# Patient Record
Sex: Female | Born: 1980 | Race: Black or African American | Hispanic: No | Marital: Married | Smoking: Never smoker
Health system: Southern US, Community
[De-identification: ages and names within clinical notes are randomized; demographics above are authoritative.]

## PROBLEM LIST (undated history)

## (undated) DIAGNOSIS — E876 Hypokalemia: Secondary | ICD-10-CM

## (undated) DIAGNOSIS — Z8679 Personal history of other diseases of the circulatory system: Secondary | ICD-10-CM

## (undated) DIAGNOSIS — M329 Systemic lupus erythematosus, unspecified: Secondary | ICD-10-CM

## (undated) HISTORY — PX: SLEEVE GASTROPLASTY: SHX1101

## (undated) HISTORY — DX: Hypokalemia: E87.6

## (undated) HISTORY — DX: Systemic lupus erythematosus, unspecified: M32.9

## (undated) HISTORY — PX: TONSILLECTOMY: SUR1361

## (undated) HISTORY — DX: Personal history of other diseases of the circulatory system: Z86.79

---

## 2011-06-11 ENCOUNTER — Emergency Department (HOSPITAL_COMMUNITY)
Admission: EM | Admit: 2011-06-11 | Discharge: 2011-06-11 | Disposition: A | Discharge status: Home / Self Care | Payer: 59 | Source: Home / Self Care | Attending: Family Medicine | Admitting: Family Medicine

## 2011-06-11 ENCOUNTER — Encounter (HOSPITAL_COMMUNITY): Payer: Self-pay

## 2011-06-11 DIAGNOSIS — B349 Viral infection, unspecified: Secondary | ICD-10-CM

## 2011-06-11 DIAGNOSIS — N390 Urinary tract infection, site not specified: Secondary | ICD-10-CM

## 2011-06-11 DIAGNOSIS — B9789 Other viral agents as the cause of diseases classified elsewhere: Secondary | ICD-10-CM

## 2011-06-11 LAB — POCT PREGNANCY, URINE: Preg Test, Ur: NEGATIVE

## 2011-06-11 MED ORDER — CIPROFLOXACIN HCL 500 MG PO TABS: 500.0000 mg | ORAL_TABLET | Freq: Two times a day (BID) | ORAL | Status: AC

## 2011-06-11 MED ORDER — ONDANSETRON HCL 4 MG PO TABS: 4.0000 mg | ORAL_TABLET | Freq: Three times a day (TID) | ORAL | Status: AC | PRN

## 2011-06-11 MED ORDER — HYDROCODONE-ACETAMINOPHEN 7.5-500 MG/15ML PO SOLN: 15.0000 mL | Freq: Three times a day (TID) | ORAL | Status: AC | PRN

## 2011-06-11 NOTE — ED Provider Notes (Signed)
History     CSN: [id number]  Arrival date & time 06/11/11  1750   First MD Initiated Contact with Patient 06/11/11 1840      Chief Complaint  Patient presents with  . Abdominal Pain    (Consider location/radiation/quality/duration/timing/severity/associated sxs/prior treatment) HPI Comments: 31 year old female with history of obesity. Here complaining of diffuse abdominal pain, nausea, and vomiting for 3 days. Associated with nasal congestion and sore throat. Denies diarrhea. No fever but has had chills. No cough, shortness of breath or chest pain. Last vomiting yesterday evening food content no blood or bile. Has not had a menstrual period in over 2 years as she had a Mirena IUD that was  removed last week and patient was started on oral contraceptive pills. Her pregnancy test is negative today. Denies dysuria or hematuria no flank or back pain no vaginal discharge. Husband recent gastroenteritis symptoms.   History reviewed. No pertinent past medical history.  History reviewed. No pertinent past surgical history.  History reviewed. No pertinent family history.  History  Substance Use Topics  . Smoking status: Never Smoker   . Smokeless tobacco: Not on file  . Alcohol Use: No    OB History    Grav Para Term Preterm Abortions TAB SAB Ect Mult Living                  Review of Systems  Constitutional: Positive for appetite change. Negative for fever and chills.  HENT: Positive for congestion, sore throat and rhinorrhea.   Respiratory: Negative for cough and shortness of breath.   Gastrointestinal: Positive for nausea, vomiting and abdominal pain. Negative for diarrhea.  Genitourinary: Negative for dysuria, frequency, flank pain, vaginal bleeding and vaginal discharge.  Skin: Negative for rash.    Allergies  Review of patient's allergies indicates no known allergies.  Home Medications   Current Outpatient Rx  Name Route Sig Dispense Refill  . DIMENHYDRINATE 50 MG PO  TABS Oral Take 50 mg by mouth every 8 (eight) hours as needed.    Marland Kitchen CIPROFLOXACIN HCL 500 MG PO TABS Oral Take 1 tablet (500 mg total) by mouth every 12 (twelve) hours. 6 tablet 0  . HYDROCODONE-ACETAMINOPHEN 7.5-500 MG/15ML PO SOLN Oral Take 15 mLs by mouth every 8 (eight) hours as needed for pain or cough. 120 mL 0  . ONDANSETRON HCL 4 MG PO TABS Oral Take 1 tablet (4 mg total) by mouth every 8 (eight) hours as needed for nausea. 10 tablet 0    BP 133/78  Pulse 82  Temp(Src) 98.2 F (36.8 C) (Oral)  Resp 16  SpO2 99%  LMP 05/21/2011  Physical Exam  Constitutional: She is oriented to person, place, and time. She appears well-developed and well-nourished. No distress.  HENT:  Head: Normocephalic and atraumatic.       Nasal Congestion with erythema and swelling of nasal turbinates, clear rhinorrhea. pharyngeal erythema no exudates. No uvula deviation. No trismus. TM's normal  Eyes: Conjunctivae and EOM are normal. Pupils are equal, round, and reactive to light. No scleral icterus.  Neck: Normal range of motion. Neck supple.  Cardiovascular: Normal heart sounds.   Pulmonary/Chest: Breath sounds normal.  Abdominal: Soft. Bowel sounds are normal. She exhibits no distension and no mass. There is no tenderness. There is no rebound and no guarding.  Lymphadenopathy:    She has no cervical adenopathy.  Neurological: She is alert and oriented to person, place, and time.  Skin: No rash noted.    ED Course  Procedures (including critical care time)   Labs Reviewed  POCT PREGNANCY, URINE  URINE CULTURE   No results found.   1. Viral illness   2. UTI (lower urinary tract infection)       MDM  Impress viral illness. Point-of-care UDS suggestive of urinary tract infection patient had recent instrumentation for IUD removal. I decided to treat with ciprofloxacin and ondansetron. Urine has been sent for culture. Recommendations for supportive care provided.        Sharin Grave, MD 06/12/11 1627

## 2011-06-11 NOTE — Discharge Instructions (Signed)
My impression is that you have a viral illness. Is very important to keep well-hydrated. Can get hydration salts over-the-counter and mix with water. Can also take low-calorie Gatorade. Take the prescribed medications as instructed. I decided to prescribe an antibiotic to take twice a day for 3 days given the findings in your urine test.  For possible urinary tract infection . Return if new onset of fever, chills, persistent vomiting and not keeping fluids down despite following treatment.

## 2011-06-11 NOTE — ED Notes (Signed)
C/o upper abdominal and lower abdominal area pain for past 3 days; vomiting w/o diarrhea; recently went on BCP 3 weeks ago; husband recently ill w flu like syx

## 2011-06-13 LAB — URINE CULTURE: Colony Count: 45000

## 2011-06-14 LAB — POCT URINALYSIS DIP (DEVICE)
Bilirubin Urine: NEGATIVE
Glucose, UA: NEGATIVE mg/dL
Ketones, ur: NEGATIVE mg/dL
Nitrite: NEGATIVE
Protein, ur: NEGATIVE mg/dL
Specific Gravity, Urine: 1.015 (ref 1.005–1.030)
Urobilinogen, UA: 0.2 mg/dL (ref 0.0–1.0)
pH: 7 (ref 5.0–8.0)

## 2012-07-18 ENCOUNTER — Other Ambulatory Visit: Payer: Self-pay | Admitting: Nurse Practitioner

## 2012-07-18 ENCOUNTER — Other Ambulatory Visit (HOSPITAL_COMMUNITY)
Admission: RE | Admit: 2012-07-18 | Discharge: 2012-07-18 | Disposition: A | Discharge status: Home / Self Care | Payer: 59 | Source: Ambulatory Visit | Attending: Obstetrics and Gynecology | Admitting: Obstetrics and Gynecology

## 2012-07-18 DIAGNOSIS — Z01419 Encounter for gynecological examination (general) (routine) without abnormal findings: Secondary | ICD-10-CM | POA: Insufficient documentation

## 2012-07-18 DIAGNOSIS — Z1151 Encounter for screening for human papillomavirus (HPV): Secondary | ICD-10-CM | POA: Insufficient documentation

## 2015-03-12 ENCOUNTER — Observation Stay (HOSPITAL_COMMUNITY)
Admission: EM | Admit: 2015-03-12 | Discharge: 2015-03-13 | DRG: 202 | Disposition: A | Discharge status: Home / Self Care | Payer: 59 | Attending: Internal Medicine | Admitting: Internal Medicine

## 2015-03-12 ENCOUNTER — Emergency Department (HOSPITAL_COMMUNITY): Payer: 59

## 2015-03-12 ENCOUNTER — Encounter (HOSPITAL_COMMUNITY): Payer: Self-pay | Admitting: *Deleted

## 2015-03-12 DIAGNOSIS — Z888 Allergy status to other drugs, medicaments and biological substances status: Secondary | ICD-10-CM | POA: Diagnosis not present

## 2015-03-12 DIAGNOSIS — E876 Hypokalemia: Secondary | ICD-10-CM | POA: Diagnosis not present

## 2015-03-12 DIAGNOSIS — J383 Other diseases of vocal cords: Secondary | ICD-10-CM | POA: Diagnosis not present

## 2015-03-12 DIAGNOSIS — Z87892 Personal history of anaphylaxis: Secondary | ICD-10-CM | POA: Diagnosis not present

## 2015-03-12 DIAGNOSIS — R0600 Dyspnea, unspecified: Secondary | ICD-10-CM | POA: Diagnosis not present

## 2015-03-12 DIAGNOSIS — R0781 Pleurodynia: Secondary | ICD-10-CM | POA: Diagnosis not present

## 2015-03-12 DIAGNOSIS — I10 Essential (primary) hypertension: Secondary | ICD-10-CM | POA: Diagnosis not present

## 2015-03-12 DIAGNOSIS — J385 Laryngeal spasm: Secondary | ICD-10-CM | POA: Diagnosis not present

## 2015-03-12 DIAGNOSIS — R911 Solitary pulmonary nodule: Secondary | ICD-10-CM

## 2015-03-12 DIAGNOSIS — J45901 Unspecified asthma with (acute) exacerbation: Principal | ICD-10-CM | POA: Diagnosis present

## 2015-03-12 DIAGNOSIS — E872 Acidosis: Secondary | ICD-10-CM | POA: Diagnosis present

## 2015-03-12 DIAGNOSIS — R490 Dysphonia: Secondary | ICD-10-CM | POA: Diagnosis not present

## 2015-03-12 DIAGNOSIS — R0602 Shortness of breath: Secondary | ICD-10-CM | POA: Diagnosis present

## 2015-03-12 DIAGNOSIS — R079 Chest pain, unspecified: Secondary | ICD-10-CM | POA: Diagnosis not present

## 2015-03-12 LAB — I-STAT TROPONIN, ED: Troponin i, poc: 0 ng/mL (ref 0.00–0.08)

## 2015-03-12 LAB — COMPREHENSIVE METABOLIC PANEL
ALBUMIN: 3.1 g/dL — AB (ref 3.5–5.0)
ALK PHOS: 57 U/L (ref 38–126)
ALT: 11 U/L — ABNORMAL LOW (ref 14–54)
ANION GAP: 12 (ref 5–15)
AST: 24 U/L (ref 15–41)
BILIRUBIN TOTAL: 0.5 mg/dL (ref 0.3–1.2)
BUN: 10 mg/dL (ref 6–20)
CALCIUM: 8.9 mg/dL (ref 8.9–10.3)
CO2: 18 mmol/L — AB (ref 22–32)
Chloride: 109 mmol/L (ref 101–111)
Creatinine, Ser: 0.96 mg/dL (ref 0.44–1.00)
GFR calc Af Amer: >60 mL/min (ref >60)
GFR calc non Af Amer: >60 mL/min (ref >60)
GLUCOSE: 102 mg/dL — AB (ref 65–99)
POTASSIUM: 3 mmol/L — AB (ref 3.5–5.1)
SODIUM: 139 mmol/L (ref 135–145)
Total Protein: 8.2 g/dL — ABNORMAL HIGH (ref 6.5–8.1)

## 2015-03-12 LAB — I-STAT CHEM 8, ED
BUN: 12 mg/dL (ref 6–20)
CALCIUM ION: 1.11 mmol/L — AB (ref 1.12–1.23)
CHLORIDE: 108 mmol/L (ref 101–111)
CREATININE: 0.8 mg/dL (ref 0.44–1.00)
Glucose, Bld: 117 mg/dL — ABNORMAL HIGH (ref 65–99)
HCT: 36 % (ref 36.0–46.0)
Hemoglobin: 12.2 g/dL (ref 12.0–15.0)
Potassium: 2.6 mmol/L — CL (ref 3.5–5.1)
SODIUM: 144 mmol/L (ref 135–145)
TCO2: 17 mmol/L (ref 0–100)

## 2015-03-12 LAB — CBC WITH DIFFERENTIAL/PLATELET
BASOS PCT: 0 %
Basophils Absolute: 0 10*3/uL (ref 0.0–0.1)
Eosinophils Absolute: 0 10*3/uL (ref 0.0–0.7)
Eosinophils Relative: 0 %
HEMATOCRIT: 31.2 % — AB (ref 36.0–46.0)
HEMOGLOBIN: 10.1 g/dL — AB (ref 12.0–15.0)
LYMPHS ABS: 3.1 10*3/uL (ref 0.7–4.0)
LYMPHS PCT: 29 %
MCH: 24.4 pg — AB (ref 26.0–34.0)
MCHC: 32.4 g/dL (ref 30.0–36.0)
MCV: 75.4 fL — ABNORMAL LOW (ref 78.0–100.0)
MONOS PCT: 8 %
Monocytes Absolute: 0.9 10*3/uL (ref 0.1–1.0)
NEUTROS ABS: 6.8 10*3/uL (ref 1.7–7.7)
Neutrophils Relative %: 63 %
Platelets: 308 10*3/uL (ref 150–400)
RBC: 4.14 MIL/uL (ref 3.87–5.11)
RDW: 13.7 % (ref 11.5–15.5)
WBC: 10.8 10*3/uL — ABNORMAL HIGH (ref 4.0–10.5)

## 2015-03-12 LAB — BRAIN NATRIURETIC PEPTIDE: B Natriuretic Peptide: 191.4 pg/mL — ABNORMAL HIGH (ref 0.0–100.0)

## 2015-03-12 LAB — RAPID URINE DRUG SCREEN, HOSP PERFORMED
AMPHETAMINES: NOT DETECTED
BARBITURATES: NOT DETECTED
Benzodiazepines: NOT DETECTED
Cocaine: NOT DETECTED
Opiates: NOT DETECTED
TETRAHYDROCANNABINOL: NOT DETECTED

## 2015-03-12 LAB — D-DIMER, QUANTITATIVE: D-Dimer, Quant: 0.46 ug/mL-FEU (ref 0.00–0.50)

## 2015-03-12 LAB — TROPONIN I: Troponin I: <0.03 ng/mL (ref <0.031)

## 2015-03-12 MED ORDER — ALBUTEROL SULFATE (2.5 MG/3ML) 0.083% IN NEBU
10.0000 mg | INHALATION_SOLUTION | Freq: Once | RESPIRATORY_TRACT | Status: DC
  Filled 2015-03-12: qty 12

## 2015-03-12 MED ORDER — METHYLPREDNISOLONE SODIUM SUCC 125 MG IJ SOLR
125.0000 mg | Freq: Once | INTRAMUSCULAR | Status: AC
  Administered 2015-03-12: 125 mg via INTRAVENOUS
  Filled 2015-03-12: qty 2

## 2015-03-12 MED ORDER — FUROSEMIDE 10 MG/ML IJ SOLN
60.0000 mg | Freq: Once | INTRAMUSCULAR | Status: AC
  Administered 2015-03-12: 60 mg via INTRAVENOUS
  Filled 2015-03-12: qty 6

## 2015-03-12 MED ORDER — ALBUTEROL SULFATE (2.5 MG/3ML) 0.083% IN NEBU
5.0000 mg | INHALATION_SOLUTION | Freq: Once | RESPIRATORY_TRACT | Status: AC
  Administered 2015-03-12: 5 mg via RESPIRATORY_TRACT

## 2015-03-12 MED ORDER — POTASSIUM CHLORIDE 10 MEQ/100ML IV SOLN
10.0000 meq | Freq: Once | INTRAVENOUS | Status: AC
  Administered 2015-03-12: 10 meq via INTRAVENOUS
  Filled 2015-03-12: qty 100

## 2015-03-12 MED ORDER — ALBUTEROL SULFATE (2.5 MG/3ML) 0.083% IN NEBU
INHALATION_SOLUTION | RESPIRATORY_TRACT | Status: AC
  Filled 2015-03-12: qty 6

## 2015-03-12 MED ORDER — MAGNESIUM SULFATE 50 % IJ SOLN: 1.0000 g | Freq: Once | INTRAMUSCULAR | Status: DC

## 2015-03-12 MED ORDER — MAGNESIUM SULFATE IN D5W 10-5 MG/ML-% IV SOLN
1.0000 g | Freq: Once | INTRAVENOUS | Status: AC
Start: 2015-03-12 — End: 2015-03-12
  Administered 2015-03-12: 1 g via INTRAVENOUS
  Filled 2015-03-12: qty 100

## 2015-03-12 NOTE — ED Notes (Signed)
MD at bedside. 

## 2015-03-12 NOTE — H&P (Addendum)
WUJ:[id number]:none   Referring provider Radford PaxBeaton   Chief Complaint:  Shortness of breath and chest pain  HPI: Connie Foster is a 34 y.o. female   has no past medical history on file.   Presented with shortness of breath or wheezing starting this morning. Not preceded by cough or URI symptoms. She has no history of asthma or eczema or allergies but her mother has history of seasonal allergies associated with prolonged cough. Interventions the problem patient was not hypoxic but was persistently wheezing d-dimer was noted to be within normal limits, troponin within normal limits, BNP slightly elevated at 191.4 chest x-ray showed no evidence of pulmonary edema or cardiomegaly or infiltrate.   She did in the past had a history of anaphylactic reaction to lisinopril. Denies any new medications no new foods no new perfumes and change in household cleaners.  She unable to speak in complete sentences. In emergency department patient was given albuterol nebulizer with partial responsiveness at this point has been given oxygen and continuous and supplemental 125. Given elevated BNP ER provided administrative dosing Lasix so far in respiratory distress.   Hospitalist was called for admission for severe bronchospasm  Review of Systems:    Pertinent positives include: shortness of breath at rest. dyspnea on exertion, non-productive cough  Constitutional:  No weight loss, night sweats, Fevers, chills, fatigue, weight loss  HEENT:  No headaches, Difficulty swallowing,Tooth/dental problems,Sore throat,  No sneezing, itching, ear ache, nasal congestion, post nasal drip,  Cardio-vascular:  No chest pain, Orthopnea, PND, anasarca, dizziness, palpitations.no Bilateral lower extremity swelling  GI:  No heartburn, indigestion, abdominal pain, nausea, vomiting, diarrhea, change in bowel habits, loss of appetite, melena, blood in stool, hematemesis Resp:   No excess mucus, no productive cough, No , No coughing  up of blood.No change in color of mucus.No wheezing. Skin:  no rash or lesions. No jaundice GU:  no dysuria, change in color of urine, no urgency or frequency. No straining to urinate.  No flank pain.  Musculoskeletal:  No joint pain or no joint swelling. No decreased range of motion. No back pain.  Psych:  No change in mood or affect. No depression or anxiety. No memory loss.  Neuro: no localizing neurological complaints, no tingling, no weakness, no double vision, no gait abnormality, no slurred speech, no confusion  Otherwise ROS are negative except for above, 10 systems were reviewed  Past Medical History: History reviewed. No pertinent past medical history. History reviewed. No pertinent past surgical history.   Medications: Prior to Admission medications   Not on File    Allergies:   Allergies  Allergen Reactions  . Lisinopril Anaphylaxis and Swelling    Social History:  Ambulatory   independently   Lives at home alone,         reports that she has never smoked. She does not have any smokeless tobacco history on file. She reports that she does not drink alcohol or use illicit drugs.    Family History: family history includes Allergies in her mother; Hypertension in her mother.    Physical Exam: Patient Vitals for the past 24 hrs:  BP Temp Temp src Pulse Resp SpO2  03/12/15 2030 113/74 mmHg - - 97 21 100 %  03/12/15 2000 120/68 mmHg - - 99 17 100 %  03/12/15 1915 121/63 mmHg - - 99 22 100 %  03/12/15 1739 139/73 mmHg 97.8 F (36.6 C) Oral 89 22 100 %  03/12/15 1738 - - - - -  100 %    1. General: difficulty in speaking in complete sentences, increased work of breathing 2. Psychological: Alert and  Oriented 3. Head/ENT:    Dry Mucous Membranes                          Head Non traumatic, neck supple                          Normal   Dentition 4. SKIN:   decreased Skin turgor,  Skin clean Dry and intact no rash 5. Heart: Regular rate and rhythm no  Murmur, Rub or gallop 6. Lungs: extensive wheezes through out no crackles   7. Abdomen: Soft, non-tender, Non distended 8. Lower extremities: no clubbing, cyanosis, or edema 9. Neurologically Grossly intact, moving all 4 extremities equally 10. MSK: Normal range of motion  body mass index is unknown because there is no height or weight on file.   Labs on Admission:   Results for orders placed or performed during the hospital encounter of 03/12/15 (from the past 24 hour(s))  D-dimer, quantitative (not at [facility])     Status: None   Collection Time: 03/12/15  7:03 PM  Result Value Ref Range   D-Dimer, Quant 0.46 0.00 - 0.50 ug/mL-FEU  I-stat troponin, ED     Status: None   Collection Time: 03/12/15  7:04 PM  Result Value Ref Range   Troponin i, poc 0.00 0.00 - 0.08 ng/mL   Comment 3          Brain natriuretic peptide     Status: Abnormal   Collection Time: 03/12/15  7:04 PM  Result Value Ref Range   B Natriuretic Peptide 191.4 (H) 0.0 - 100.0 pg/mL  I-stat chem 8, ed     Status: Abnormal   Collection Time: 03/12/15  7:06 PM  Result Value Ref Range   Sodium 144 135 - 145 mmol/L   Potassium 2.6 (LL) 3.5 - 5.1 mmol/L   Chloride 108 101 - 111 mmol/L   BUN 12 6 - 20 mg/dL   Creatinine, Ser 8.46 0.44 - 1.00 mg/dL   Glucose, Bld 962 (H) 65 - 99 mg/dL   Calcium, Ion 9.52 (L) 1.12 - 1.23 mmol/L   TCO2 17 0 - 100 mmol/L   Hemoglobin 12.2 12.0 - 15.0 g/dL   HCT 84.1 32.4 - 40.1 %    UA not obtained  No results found for: HGBA1C  CrCl cannot be calculated (Unknown ideal weight.).  BNP (last 3 results) No results for input(s): PROBNP in the last 8760 hours.  Other results:  I have pearsonaly reviewed this: ECG REPORT  Rate:87   Rhythm: NSR ST&T Change: no ischemic changes QTC 430   There were no vitals filed for this visit.   Cultures:    Component Value Date/Time   SDES URINE, CLEAN CATCH 06/11/2011 1916   SPECREQUEST NONE 06/11/2011 1916   CULT  06/11/2011 1916     Multiple bacterial morphotypes present, none predominant. Suggest appropriate recollection if clinically indicated.   REPTSTATUS 06/13/2011 FINAL 06/11/2011 1916     Radiological Exams on Admission: Dg Chest 2 View  03/12/2015  CLINICAL DATA:  Cough, shortness of Breath EXAM: CHEST  2 VIEW COMPARISON:  None. FINDINGS: Cardiomediastinal silhouette is unremarkable. No acute infiltrate or pleural effusion. No pulmonary edema. Bony thorax is unremarkable. IMPRESSION: No active cardiopulmonary disease. Electronically Signed   By: Natasha Mead  M.D.   On: 03/12/2015 18:15    Chart has been reviewed  Family   at  Bedside  plan of care was discussed with Mother and brother  Assessment/Plan  34 year old female with no prior history of asthma presents with wheezing shortness of breath and chest pain is increased work of breathing.  Present on Admission:  . Chest pain typical most likely secondary to respiratory symptoms. We'll cycle cardiac enzymes  . Dyspnea sitting no bronchus Spasm versus upper airway disease will discuss with pulmonology appreciated consult  . Hypokalemia will replace check magnesium level  . Reactive airway disease with wheezing with acute exacerbation -worrisome for bronchospasm. We'll treat for reactive airway disease with steroids and bronchodilators continuous neb. D-dimer is negative, nor risk factors for PE   Prophylaxis:   Lovenox   CODE STATUS:  FULL CODE   as per patient    Disposition:  To home once workup is complete and patient is stable  Other plan as per orders.  I have spent a total of 65 min on this admissionExtra time was taken to discuss case with pulmonology for consult  Deundra Bard 03/12/2015, 10:00 PM  [facility]  Pager [phone]   after 2 AM please page floor coverage PA If 7AM-7PM, please contact the day team taking care of the patient  [email or web address]  Password [email or web address]
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
## 2015-03-12 NOTE — ED Notes (Signed)
Pt reports approx one hour ago having onset of mid chest pain, feels like she can't catch her breath and now has a cough. Denies and hx of asthma. spo2 100%.

## 2015-03-12 NOTE — ED Provider Notes (Signed)
CSN: [id number]     Arrival date & time 03/12/15  1733 History   First MD Initiated Contact with Patient 03/12/15 1837     Chief Complaint  Patient presents with  . Shortness of Breath      HPI Pt reports approx one hour ago having onset of mid chest pain, feels like she can't catch her breath and now has a cough. Denies and hx of asthma. spo2 100%.  History reviewed. No pertinent past medical history. History reviewed. No pertinent past surgical history. Family History  Problem Relation Age of Onset  . Hypertension Mother   . Allergies Mother   . Breast cancer Maternal Grandmother    Social History  Substance Use Topics  . Smoking status: Never Smoker   . Smokeless tobacco: None  . Alcohol Use: No   OB History    No data available     Review of Systems  All other systems reviewed and are negative  Allergies  Lisinopril  Home Medications   Prior to Admission medications   Medication Sig Start Date End Date Taking? Authorizing Provider  albuterol (PROVENTIL HFA;VENTOLIN HFA) 108 (90 BASE) MCG/ACT inhaler Inhale 2 puffs into the lungs every 4 (four) hours as needed for wheezing or shortness of breath. 03/13/15   Shanker Levora DredgeM Ghimire, MD  budesonide-formoterol (SYMBICORT) 160-4.5 MCG/ACT inhaler Inhale 2 puffs into the lungs 2 (two) times daily. 03/13/15   Shanker Levora DredgeM Ghimire, MD  pantoprazole (PROTONIX) 40 MG tablet Take 1 tablet (40 mg total) by mouth daily. 03/13/15   Shanker Levora DredgeM Ghimire, MD  predniSONE (DELTASONE) 10 MG tablet Take 4 tablets (40 mg) daily for 2 days, then, Take 3 tablets (30 mg) daily for 2 days, then, Take 2 tablets (20 mg) daily for 2 days, then, Take 1 tablets (10 mg) daily for 1 days, then stop 03/13/15   Maretta BeesShanker M Ghimire, MD   BP 116/71 mmHg  Pulse 72  Temp(Src) 98.3 F (36.8 C) (Oral)  Resp 20  Ht 5\' 5"  (1.651 m)  Wt 203 lb 0.7 oz (92.1 kg)  BMI 33.79 kg/m2  SpO2 99% Physical Exam Physical Exam  Nursing note and vitals  reviewed. Constitutional: She is oriented to person, place, and time. She appears well-developed and well-nourished. No distress.  HENT:  Head: Normocephalic and atraumatic.  Eyes: Pupils are equal, round, and reactive to light.  Neck: Normal range of motion.  Cardiovascular: Normal rate and intact distal pulses.   Pulmonary/Chest: MIld respiratory distress. Audible wheezing. Abdominal: Normal appearance. She exhibits no distension.  Musculoskeletal: Normal range of motion.  Neurological: She is alert and oriented to person, place, and time. No cranial nerve deficit.  Skin: Skin is warm and dry. No rash noted.  Psychiatric: She has a normal mood and affect. Her behavior is normal.   ED Course  Procedures (including critical care time) .edmede  Labs Review Labs Reviewed  BRAIN NATRIURETIC PEPTIDE - Abnormal; Notable for the following:    B Natriuretic Peptide 191.4 (*)    All other components within normal limits  COMPREHENSIVE METABOLIC PANEL - Abnormal; Notable for the following:    Potassium 3.0 (*)    CO2 18 (*)    Glucose, Bld 102 (*)    Total Protein 8.2 (*)    Albumin 3.1 (*)    ALT 11 (*)    All other components within normal limits  CBC WITH DIFFERENTIAL/PLATELET - Abnormal; Notable for the following:    WBC 10.8 (*)  Hemoglobin 10.1 (*)    HCT 31.2 (*)    MCV 75.4 (*)    MCH 24.4 (*)    All other components within normal limits  COMPREHENSIVE METABOLIC PANEL - Abnormal; Notable for the following:    Glucose, Bld 149 (*)    Albumin 2.9 (*)    ALT 10 (*)    All other components within normal limits  CBC - Abnormal; Notable for the following:    Hemoglobin 9.7 (*)    HCT 31.3 (*)    MCV 75.4 (*)    MCH 23.4 (*)    All other components within normal limits  I-STAT CHEM 8, ED - Abnormal; Notable for the following:    Potassium 2.6 (*)    Glucose, Bld 117 (*)    Calcium, Ion 1.11 (*)    All other components within normal limits  MRSA PCR SCREENING  D-DIMER,  QUANTITATIVE (NOT AT [facility]  TROPONIN I  TROPONIN I  TROPONIN I  URINE RAPID DRUG SCREEN, HOSP PERFORMED  MAGNESIUM  RHEUMATOID FACTOR  CYCLIC CITRUL PEPTIDE ANTIBODY, IGG/IGA  MAGNESIUM  PHOSPHORUS  TSH  ANTINUCLEAR ANTIBODIES, IFA  I-STAT TROPOININ, ED    Imaging Review    DG Chest 2 View (Final result) Result time: 03/12/15 18:15:06   Final result by Rad Results In Interface (03/12/15 18:15:06)   Narrative:   CLINICAL DATA: Cough, shortness of Breath  EXAM: CHEST 2 VIEW  COMPARISON: None.  FINDINGS: Cardiomediastinal silhouette is unremarkable. No acute infiltrate or pleural effusion. No pulmonary edema. Bony thorax is unremarkable.  IMPRESSION: No active cardiopulmonary disease.   Electronically Signed By: Natasha Mead M.D. On: 03/12/2015 18:15     EKG Interpretation   Date/Time:  Wednesday March 12 2015 17:44:33 EST Ventricular Rate:  87 PR Interval:  156 QRS Duration: 86 QT Interval:  358 QTC Calculation: 430 R Axis:   71 Text Interpretation:  Normal sinus rhythm Normal ECG Confirmed by Radford Pax   MD, Zali Kamaka ([id number]) on 03/12/2015 6:38:16 PM      MDM   Final diagnoses:  Lung nodule  Wheezing      Nelva Nay, MD 03/20/15 0710

## 2015-03-12 NOTE — Progress Notes (Signed)
RT note: Upon arriving to administer Albuterol 10mg  cont. Neb the RN informed me the patient was going upstairs and asked if we could do the treatment when she went to [facility]. I told her we could in the ICU. Report was called to [place], RT.

## 2015-03-13 ENCOUNTER — Inpatient Hospital Stay (HOSPITAL_COMMUNITY): Payer: 59

## 2015-03-13 DIAGNOSIS — R0781 Pleurodynia: Secondary | ICD-10-CM | POA: Diagnosis not present

## 2015-03-13 DIAGNOSIS — R0600 Dyspnea, unspecified: Secondary | ICD-10-CM | POA: Diagnosis not present

## 2015-03-13 DIAGNOSIS — I10 Essential (primary) hypertension: Secondary | ICD-10-CM | POA: Diagnosis not present

## 2015-03-13 DIAGNOSIS — R911 Solitary pulmonary nodule: Secondary | ICD-10-CM

## 2015-03-13 DIAGNOSIS — E876 Hypokalemia: Secondary | ICD-10-CM | POA: Diagnosis not present

## 2015-03-13 DIAGNOSIS — J45901 Unspecified asthma with (acute) exacerbation: Principal | ICD-10-CM

## 2015-03-13 DIAGNOSIS — E872 Acidosis: Secondary | ICD-10-CM | POA: Diagnosis not present

## 2015-03-13 LAB — COMPREHENSIVE METABOLIC PANEL
ALBUMIN: 2.9 g/dL — AB (ref 3.5–5.0)
ALK PHOS: 60 U/L (ref 38–126)
ALT: 10 U/L — AB (ref 14–54)
AST: 22 U/L (ref 15–41)
Anion gap: 9 (ref 5–15)
BUN: 6 mg/dL (ref 6–20)
CALCIUM: 8.9 mg/dL (ref 8.9–10.3)
CO2: 22 mmol/L (ref 22–32)
CREATININE: 0.91 mg/dL (ref 0.44–1.00)
Chloride: 106 mmol/L (ref 101–111)
GFR calc Af Amer: >60 mL/min (ref >60)
GFR calc non Af Amer: >60 mL/min (ref >60)
GLUCOSE: 149 mg/dL — AB (ref 65–99)
Potassium: 3.8 mmol/L (ref 3.5–5.1)
SODIUM: 137 mmol/L (ref 135–145)
Total Bilirubin: 0.6 mg/dL (ref 0.3–1.2)
Total Protein: 7.8 g/dL (ref 6.5–8.1)

## 2015-03-13 LAB — PHOSPHORUS: Phosphorus: 3.7 mg/dL (ref 2.5–4.6)

## 2015-03-13 LAB — MRSA PCR SCREENING: MRSA by PCR: NEGATIVE

## 2015-03-13 LAB — TSH: TSH: 1.271 u[IU]/mL (ref 0.350–4.500)

## 2015-03-13 LAB — CBC
HCT: 31.3 % — ABNORMAL LOW (ref 36.0–46.0)
Hemoglobin: 9.7 g/dL — ABNORMAL LOW (ref 12.0–15.0)
MCH: 23.4 pg — AB (ref 26.0–34.0)
MCHC: 31 g/dL (ref 30.0–36.0)
MCV: 75.4 fL — ABNORMAL LOW (ref 78.0–100.0)
PLATELETS: 300 10*3/uL (ref 150–400)
RBC: 4.15 MIL/uL (ref 3.87–5.11)
RDW: 13.6 % (ref 11.5–15.5)
WBC: 5.9 10*3/uL (ref 4.0–10.5)

## 2015-03-13 LAB — TROPONIN I: Troponin I: <0.03 ng/mL (ref <0.031)

## 2015-03-13 LAB — MAGNESIUM
Magnesium: 1.8 mg/dL (ref 1.7–2.4)
Magnesium: 2 mg/dL (ref 1.7–2.4)

## 2015-03-13 MED ORDER — IPRATROPIUM-ALBUTEROL 0.5-2.5 (3) MG/3ML IN SOLN
3.0000 mL | Freq: Four times a day (QID) | RESPIRATORY_TRACT | Status: DC
  Administered 2015-03-13 (×2): 3 mL via RESPIRATORY_TRACT
  Filled 2015-03-13 (×2): qty 3

## 2015-03-13 MED ORDER — POTASSIUM CHLORIDE 10 MEQ/100ML IV SOLN
10.0000 meq | INTRAVENOUS | Status: AC
  Administered 2015-03-13 (×4): 10 meq via INTRAVENOUS
  Filled 2015-03-13 (×4): qty 100

## 2015-03-13 MED ORDER — BUDESONIDE-FORMOTEROL FUMARATE 160-4.5 MCG/ACT IN AERO
2.0000 | INHALATION_SPRAY | Freq: Two times a day (BID) | RESPIRATORY_TRACT | Status: DC
  Administered 2015-03-13: 2 via RESPIRATORY_TRACT
  Filled 2015-03-13: qty 6

## 2015-03-13 MED ORDER — ACETAMINOPHEN 650 MG RE SUPP: 650.0000 mg | Freq: Four times a day (QID) | RECTAL | Status: DC | PRN

## 2015-03-13 MED ORDER — HYDROCODONE-ACETAMINOPHEN 5-325 MG PO TABS: 1.0000 | ORAL_TABLET | ORAL | Status: DC | PRN

## 2015-03-13 MED ORDER — PANTOPRAZOLE SODIUM 40 MG PO TBEC: 40.0000 mg | DELAYED_RELEASE_TABLET | Freq: Every day | ORAL | Status: DC

## 2015-03-13 MED ORDER — ALBUTEROL SULFATE (2.5 MG/3ML) 0.083% IN NEBU: 2.5000 mg | INHALATION_SOLUTION | RESPIRATORY_TRACT | Status: DC | PRN

## 2015-03-13 MED ORDER — TRIPLE ANTIBIOTIC 3.5-400-5000 EX OINT: 1.0000 "application " | TOPICAL_OINTMENT | Freq: Once | CUTANEOUS | Status: DC | PRN

## 2015-03-13 MED ORDER — IBUPROFEN 200 MG PO TABS: 400.0000 mg | ORAL_TABLET | Freq: Four times a day (QID) | ORAL | Status: DC | PRN

## 2015-03-13 MED ORDER — SILVER NITRATE-POT NITRATE 75-25 % EX MISC
10.0000 | Freq: Once | CUTANEOUS | Status: DC | PRN
  Filled 2015-03-13: qty 10

## 2015-03-13 MED ORDER — ONDANSETRON HCL 4 MG/2ML IJ SOLN: 4.0000 mg | Freq: Four times a day (QID) | INTRAMUSCULAR | Status: DC | PRN

## 2015-03-13 MED ORDER — ALBUTEROL SULFATE HFA 108 (90 BASE) MCG/ACT IN AERS: 2.0000 | INHALATION_SPRAY | RESPIRATORY_TRACT | Status: DC | PRN

## 2015-03-13 MED ORDER — ENOXAPARIN SODIUM 40 MG/0.4ML ~~LOC~~ SOLN
40.0000 mg | SUBCUTANEOUS | Status: DC
  Administered 2015-03-13: 40 mg via SUBCUTANEOUS
  Filled 2015-03-13: qty 0.4

## 2015-03-13 MED ORDER — BENZONATATE 100 MG PO CAPS: 200.0000 mg | ORAL_CAPSULE | Freq: Three times a day (TID) | ORAL | Status: DC | PRN

## 2015-03-13 MED ORDER — ACETAMINOPHEN 325 MG PO TABS
650.0000 mg | ORAL_TABLET | Freq: Four times a day (QID) | ORAL | Status: DC | PRN
  Administered 2015-03-13: 650 mg via ORAL
  Filled 2015-03-13: qty 2

## 2015-03-13 MED ORDER — LIDOCAINE HCL 4 % EX SOLN
0.0000 mL | Freq: Once | CUTANEOUS | Status: DC | PRN
  Filled 2015-03-13 (×2): qty 50

## 2015-03-13 MED ORDER — BUDESONIDE-FORMOTEROL FUMARATE 160-4.5 MCG/ACT IN AERO: 2.0000 | INHALATION_SPRAY | Freq: Two times a day (BID) | RESPIRATORY_TRACT | Status: DC

## 2015-03-13 MED ORDER — SODIUM CHLORIDE 0.9 % IJ SOLN
3.0000 mL | Freq: Two times a day (BID) | INTRAMUSCULAR | Status: DC
  Administered 2015-03-13 (×2): 3 mL via INTRAVENOUS

## 2015-03-13 MED ORDER — BACITRACIN-NEOMYCIN-POLYMYXIN OINTMENT TUBE
TOPICAL_OINTMENT | CUTANEOUS | Status: DC | PRN
  Filled 2015-03-13: qty 15

## 2015-03-13 MED ORDER — LIDOCAINE HCL 2 % EX GEL
1.0000 "application " | Freq: Once | CUTANEOUS | Status: DC | PRN
  Filled 2015-03-13 (×2): qty 5

## 2015-03-13 MED ORDER — ONDANSETRON HCL 4 MG PO TABS: 4.0000 mg | ORAL_TABLET | Freq: Four times a day (QID) | ORAL | Status: DC | PRN

## 2015-03-13 MED ORDER — BENZONATATE 100 MG PO CAPS: 200.0000 mg | ORAL_CAPSULE | Freq: Two times a day (BID) | ORAL | Status: DC

## 2015-03-13 MED ORDER — IPRATROPIUM BROMIDE 0.02 % IN SOLN: 0.5000 mg | Freq: Four times a day (QID) | RESPIRATORY_TRACT | Status: DC

## 2015-03-13 MED ORDER — GUAIFENESIN ER 600 MG PO TB12
600.0000 mg | ORAL_TABLET | Freq: Two times a day (BID) | ORAL | Status: DC
  Administered 2015-03-13 (×2): 600 mg via ORAL
  Filled 2015-03-13 (×2): qty 1

## 2015-03-13 MED ORDER — OXYMETAZOLINE HCL 0.05 % NA SOLN
1.0000 | Freq: Once | NASAL | Status: DC | PRN
  Filled 2015-03-13: qty 15

## 2015-03-13 MED ORDER — PREDNISONE 10 MG PO TABS: ORAL_TABLET | ORAL | Status: DC

## 2015-03-13 MED ORDER — LIDOCAINE-EPINEPHRINE (PF) 1 %-1:200000 IJ SOLN
0.0000 mL | Freq: Once | INTRAMUSCULAR | Status: DC | PRN
Start: 2015-03-13 — End: 2015-03-13
  Filled 2015-03-13 (×2): qty 30

## 2015-03-13 MED ORDER — METHYLPREDNISOLONE SODIUM SUCC 125 MG IJ SOLR
60.0000 mg | Freq: Two times a day (BID) | INTRAMUSCULAR | Status: DC
  Administered 2015-03-13: 60 mg via INTRAVENOUS
  Filled 2015-03-13: qty 2

## 2015-03-13 NOTE — Progress Notes (Signed)
Reviewed and discussed discharge instructions with patient and family members at bedside. Prescriptions given to patient. PIV removed, Pt discharged to home, alert and oriented at time of discharge.
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
## 2015-03-13 NOTE — Consult Note (Signed)
Reason for Consult: Breathing trouble Referring Physician: Jonetta Osgood, MD  Connie Foster is an 34 y.o. female.  HPI: Previously Health, was screaming at her dogs yesterday and developed difficulty breathing that lasted about an hour. Since admission to the hospital, she has felt great. She has no prior history of breathing, swallowing, throat problems. She takes no medications. She has never smoked or drank and consumes minimal caffeine. She does not suffer with heartburn. Extensive workup has all been negative.  History reviewed. No pertinent past medical history.  History reviewed. No pertinent past surgical history.  Family History  Problem Relation Age of Onset  . Hypertension Mother   . Allergies Mother   . Breast cancer Maternal Grandmother     Social History:  reports that she has never smoked. She does not have any smokeless tobacco history on file. She reports that she does not drink alcohol or use illicit drugs.  Allergies:  Allergies  Allergen Reactions  . Lisinopril Anaphylaxis and Swelling    Medications: Reviewed  Results for orders placed or performed during the hospital encounter of 03/12/15 (from the past 48 hour(s))  D-dimer, quantitative (not at [facility])     Status: None   Collection Time: 03/12/15  7:03 PM  Result Value Ref Range   D-Dimer, Quant 0.46 0.00 - 0.50 ug/mL-FEU    Comment: (NOTE) At the manufacturer cut-off of 0.50 ug/mL FEU, this assay has been documented to exclude PE with a sensitivity and negative predictive value of 97 to 99%.  At this time, this assay has not been approved by the FDA to exclude DVT/VTE. Results should be correlated with clinical presentation.   I-stat troponin, ED     Status: None   Collection Time: 03/12/15  7:04 PM  Result Value Ref Range   Troponin i, poc 0.00 0.00 - 0.08 ng/mL   Comment 3            Comment: Due to the release kinetics of cTnI, a negative result within the first hours of the onset of  symptoms does not rule out myocardial infarction with certainty. If myocardial infarction is still suspected, repeat the test at appropriate intervals.   Brain natriuretic peptide     Status: Abnormal   Collection Time: 03/12/15  7:04 PM  Result Value Ref Range   B Natriuretic Peptide 191.4 (H) 0.0 - 100.0 pg/mL  I-stat chem 8, ed     Status: Abnormal   Collection Time: 03/12/15  7:06 PM  Result Value Ref Range   Sodium 144 135 - 145 mmol/L   Potassium 2.6 (LL) 3.5 - 5.1 mmol/L   Chloride 108 101 - 111 mmol/L   BUN 12 6 - 20 mg/dL   Creatinine, Ser 0.80 0.44 - 1.00 mg/dL   Glucose, Bld 117 (H) 65 - 99 mg/dL   Calcium, Ion 1.11 (L) 1.12 - 1.23 mmol/L   TCO2 17 0 - 100 mmol/L   Hemoglobin 12.2 12.0 - 15.0 g/dL   HCT 36.0 36.0 - 46.0 %  Comprehensive metabolic panel     Status: Abnormal   Collection Time: 03/12/15  9:45 PM  Result Value Ref Range   Sodium 139 135 - 145 mmol/L   Potassium 3.0 (L) 3.5 - 5.1 mmol/L   Chloride 109 101 - 111 mmol/L   CO2 18 (L) 22 - 32 mmol/L   Glucose, Bld 102 (H) 65 - 99 mg/dL   BUN 10 6 - 20 mg/dL   Creatinine, Ser 0.96 0.44 -  1.00 mg/dL   Calcium 8.9 8.9 - 10.3 mg/dL   Total Protein 8.2 (H) 6.5 - 8.1 g/dL   Albumin 3.1 (L) 3.5 - 5.0 g/dL   AST 24 15 - 41 U/L   ALT 11 (L) 14 - 54 U/L   Alkaline Phosphatase 57 38 - 126 U/L   Total Bilirubin 0.5 0.3 - 1.2 mg/dL   GFR calc non Af Amer >60 >60 mL/min   GFR calc Af Amer >60 >60 mL/min    Comment: (NOTE) The eGFR has been calculated using the CKD EPI equation. This calculation has not been validated in all clinical situations. eGFR's persistently <60 mL/min signify possible Chronic Kidney Disease.    Anion gap 12 5 - 15  CBC with Differential/Platelet     Status: Abnormal   Collection Time: 03/12/15  9:45 PM  Result Value Ref Range   WBC 10.8 (H) 4.0 - 10.5 K/uL   RBC 4.14 3.87 - 5.11 MIL/uL   Hemoglobin 10.1 (L) 12.0 - 15.0 g/dL   HCT 31.2 (L) 36.0 - 46.0 %   MCV 75.4 (L) 78.0 - 100.0 fL    MCH 24.4 (L) 26.0 - 34.0 pg   MCHC 32.4 30.0 - 36.0 g/dL   RDW 13.7 11.5 - 15.5 %   Platelets 308 150 - 400 K/uL   Neutrophils Relative % 63 %   Lymphocytes Relative 29 %   Monocytes Relative 8 %   Eosinophils Relative 0 %   Basophils Relative 0 %   Neutro Abs 6.8 1.7 - 7.7 K/uL   Lymphs Abs 3.1 0.7 - 4.0 K/uL   Monocytes Absolute 0.9 0.1 - 1.0 K/uL   Eosinophils Absolute 0.0 0.0 - 0.7 K/uL   Basophils Absolute 0.0 0.0 - 0.1 K/uL   Smear Review MORPHOLOGY UNREMARKABLE   Troponin I     Status: None   Collection Time: 03/12/15  9:45 PM  Result Value Ref Range   Troponin I <0.03 <0.031 ng/mL    Comment:        NO INDICATION OF MYOCARDIAL INJURY.   Magnesium     Status: None   Collection Time: 03/12/15  9:50 PM  Result Value Ref Range   Magnesium 1.8 1.7 - 2.4 mg/dL  Urine rapid drug screen (hosp performed)     Status: None   Collection Time: 03/12/15 10:34 PM  Result Value Ref Range   Opiates NONE DETECTED NONE DETECTED   Cocaine NONE DETECTED NONE DETECTED   Benzodiazepines NONE DETECTED NONE DETECTED   Amphetamines NONE DETECTED NONE DETECTED   Tetrahydrocannabinol NONE DETECTED NONE DETECTED   Barbiturates NONE DETECTED NONE DETECTED    Comment:        DRUG SCREEN FOR MEDICAL PURPOSES ONLY.  IF CONFIRMATION IS NEEDED FOR ANY PURPOSE, NOTIFY LAB WITHIN 5 DAYS.        LOWEST DETECTABLE LIMITS FOR URINE DRUG SCREEN Drug Class       Cutoff (ng/mL) Amphetamine      1000 Barbiturate      200 Benzodiazepine   193 Tricyclics       790 Opiates          300 Cocaine          300 THC              50   MRSA PCR Screening     Status: None   Collection Time: 03/12/15 11:49 PM  Result Value Ref Range   MRSA by PCR NEGATIVE NEGATIVE  Comment:        The GeneXpert MRSA Assay (FDA approved for NASAL specimens only), is one component of a comprehensive MRSA colonization surveillance program. It is not intended to diagnose MRSA infection nor to guide or monitor  treatment for MRSA infections.   Troponin I     Status: None   Collection Time: 03/13/15  3:17 AM  Result Value Ref Range   Troponin I <0.03 <0.031 ng/mL    Comment:        NO INDICATION OF MYOCARDIAL INJURY.   Magnesium     Status: None   Collection Time: 03/13/15  3:17 AM  Result Value Ref Range   Magnesium 2.0 1.7 - 2.4 mg/dL  Phosphorus     Status: None   Collection Time: 03/13/15  3:17 AM  Result Value Ref Range   Phosphorus 3.7 2.5 - 4.6 mg/dL  TSH     Status: None   Collection Time: 03/13/15  3:17 AM  Result Value Ref Range   TSH 1.271 0.350 - 4.500 uIU/mL  Comprehensive metabolic panel     Status: Abnormal   Collection Time: 03/13/15  3:17 AM  Result Value Ref Range   Sodium 137 135 - 145 mmol/L   Potassium 3.8 3.5 - 5.1 mmol/L    Comment: DELTA CHECK NOTED   Chloride 106 101 - 111 mmol/L   CO2 22 22 - 32 mmol/L   Glucose, Bld 149 (H) 65 - 99 mg/dL   BUN 6 6 - 20 mg/dL   Creatinine, Ser 0.91 0.44 - 1.00 mg/dL   Calcium 8.9 8.9 - 10.3 mg/dL   Total Protein 7.8 6.5 - 8.1 g/dL   Albumin 2.9 (L) 3.5 - 5.0 g/dL   AST 22 15 - 41 U/L   ALT 10 (L) 14 - 54 U/L   Alkaline Phosphatase 60 38 - 126 U/L   Total Bilirubin 0.6 0.3 - 1.2 mg/dL   GFR calc non Af Amer >60 >60 mL/min   GFR calc Af Amer >60 >60 mL/min    Comment: (NOTE) The eGFR has been calculated using the CKD EPI equation. This calculation has not been validated in all clinical situations. eGFR's persistently <60 mL/min signify possible Chronic Kidney Disease.    Anion gap 9 5 - 15  CBC     Status: Abnormal   Collection Time: 03/13/15  3:17 AM  Result Value Ref Range   WBC 5.9 4.0 - 10.5 K/uL   RBC 4.15 3.87 - 5.11 MIL/uL   Hemoglobin 9.7 (L) 12.0 - 15.0 g/dL   HCT 31.3 (L) 36.0 - 46.0 %   MCV 75.4 (L) 78.0 - 100.0 fL   MCH 23.4 (L) 26.0 - 34.0 pg   MCHC 31.0 30.0 - 36.0 g/dL   RDW 13.6 11.5 - 15.5 %   Platelets 300 150 - 400 K/uL  Troponin I     Status: None   Collection Time: 03/13/15  8:54 AM   Result Value Ref Range   Troponin I <0.03 <0.031 ng/mL    Comment:        NO INDICATION OF MYOCARDIAL INJURY.     Dg Chest 2 View  03/12/2015  CLINICAL DATA:  Cough, shortness of Breath EXAM: CHEST  2 VIEW COMPARISON:  None. FINDINGS: Cardiomediastinal silhouette is unremarkable. No acute infiltrate or pleural effusion. No pulmonary edema. Bony thorax is unremarkable. IMPRESSION: No active cardiopulmonary disease. Electronically Signed   By: Lahoma Crocker M.D.   On: 03/12/2015 18:15  Ct Chest Wo Contrast  03/13/2015  CLINICAL DATA:  Mid chest pain and shortness of breath. Lung nodule. EXAM: CT CHEST WITHOUT CONTRAST TECHNIQUE: Multidetector CT imaging of the chest was performed following the standard protocol without IV contrast. COMPARISON:  Chest x-ray from yesterday FINDINGS: THORACIC INLET/BODY WALL: No acute abnormality. MEDIASTINUM: Normal heart size. No pericardial effusion. No acute vascular abnormality. No adenopathy. LUNG WINDOWS: No consolidation.  No effusion.  No suspicious pulmonary nodule. Borderline diffuse bronchial wall thickening. Minor dependent atelectasis. UPPER ABDOMEN: Previous gastric surgery. OSSEOUS: No acute fracture.  No suspicious lytic or blastic lesions. IMPRESSION: 1. Negative for nodule. 2. Borderline bronchial wall thickening with minimal atelectasis. Electronically Signed   By: Monte Fantasia M.D.   On: 03/13/2015 04:49    BVA:POLIDCVU except as listed in admit H&P  Blood pressure 122/93, pulse 81, temperature 98.8 F (37.1 C), temperature source Oral, resp. rate 17, height '5\' 5"'  (1.651 m), weight 92.1 kg (203 lb 0.7 oz), SpO2 100 %.  PHYSICAL EXAM: Overall appearance:  Healthy appearing, in no distress, normal voice. She is in no distress. Head:  Normocephalic, atraumatic. Ears: External ears look normal. Nose: External nose is healthy in appearance. Internal nasal exam free of any lesions or obstruction. Oral Cavity/Pharynx:  There are no mucosal  lesions or masses identified. Larynx/Hypopharynx: Deferred Neuro:  No identifiable neurologic deficits. Neck: No palpable neck masses.  Studies Reviewed: none  Procedures: none   Assessment/Plan: Normal head and neck exam. She may have had acute laryngospasm secondary to laryngeal trauma from screaming. There is no evidence of any chronic pathology. No need for endoscopy. She may follow up as an outpatient if she has any additional problems.  Connie Foster 03/13/2015, 11:35 AM

## 2015-03-14 LAB — CYCLIC CITRUL PEPTIDE ANTIBODY, IGG/IGA: CCP Antibodies IgG/IgA: 7 units (ref 0–19)

## 2015-03-14 LAB — ANTINUCLEAR ANTIBODIES, IFA: ANTINUCLEAR ANTIBODIES, IFA: NEGATIVE

## 2015-03-14 LAB — RHEUMATOID FACTOR

## 2015-07-02 ENCOUNTER — Other Ambulatory Visit: Payer: Self-pay | Admitting: Obstetrics

## 2015-07-03 LAB — CYTOLOGY - PAP

## 2018-12-05 ENCOUNTER — Ambulatory Visit: Payer: 59

## 2018-12-18 ENCOUNTER — Ambulatory Visit: Payer: 59

## 2018-12-19 ENCOUNTER — Ambulatory Visit (INDEPENDENT_AMBULATORY_CARE_PROVIDER_SITE_OTHER): Payer: 59 | Admitting: Internal Medicine

## 2018-12-19 ENCOUNTER — Other Ambulatory Visit: Payer: Self-pay

## 2018-12-19 VITALS — BP 132/83 | HR 78 | Temp 97.3°F | Resp 17 | Ht 64.0 in | Wt 238.0 lb

## 2018-12-19 DIAGNOSIS — Z862 Personal history of diseases of the blood and blood-forming organs and certain disorders involving the immune mechanism: Secondary | ICD-10-CM | POA: Diagnosis not present

## 2018-12-19 DIAGNOSIS — E049 Nontoxic goiter, unspecified: Secondary | ICD-10-CM | POA: Diagnosis not present

## 2018-12-19 DIAGNOSIS — R030 Elevated blood-pressure reading, without diagnosis of hypertension: Secondary | ICD-10-CM

## 2018-12-19 DIAGNOSIS — Z23 Encounter for immunization: Secondary | ICD-10-CM

## 2018-12-19 DIAGNOSIS — M542 Cervicalgia: Secondary | ICD-10-CM

## 2018-12-19 NOTE — Progress Notes (Signed)
Patient here to get established.  Had a recent visit with urgent care & was advised to see primary care for thyroid work up. Brother has thyroid disease

## 2018-12-19 NOTE — Progress Notes (Signed)
Patient ID: Monaca Wadas, female    DOB: Apr 14, 1980  MRN: [id number]  CC: Establish Care and Thyroid Problem   Subjective: Elnor Sorbo is a 38 y.o. female who presents for new pt visit for thyroid check.  Pt seen at [facility] Her concerns today include:  Hx of obesity with sleeve gastroplasty 2015  No previous PCP.  No previous chronic medical issues.  Patient presents today out of concern that she may have a thyroid issue.  Several months ago she noted painless swelling of the anterior neck that was intermittently associated with a feeling like she could not breathe depending on certain positions.  She did a virtual health visit and was told to take ibuprofen and be seen in person by a physician for further evaluation if the problem persisted.   -Issue resolved but since then she has noticed that about once a month she would get swelling of the anterior neck associated with feeling of sore throat and feeling like she is not getting enough air when she sits up.  Episodes last about a week.  She denies any problems swallowing, no feeling of shortness of breath when lying down.  She does not feel any lumps or masses in the neck.  No hoarseness.  Denies any palpitations, significant weight changes or feeling hot or cold all the time.  Her brother has thyroid issues but she does not know specifics.  Blood pressure noted had pre-eclampsia during pregnancy 14 yrs ago and HTN.  Was on med for 6-8 mths. BP nl so was taken off med.  Denies any chest pain, shortness of breath, lower extremity edema, headaches or dizziness. -does not have home device.  HM"  Last pap was 2019.  It was nl per patient Needs Flu and Tdap vaccine  Noted to have microcytic anemia on blood test that was done 3 to 4 years ago in EMR.  Patient states she was not aware of any history of anemia.  Past medical, social, family history and surgical histories reviewed. Patient Active Problem List   Diagnosis Date Noted   . Lung nodule   . Pleurodynia   . Chest pain 03/12/2015  . Dyspnea 03/12/2015  . Hypokalemia 03/12/2015  . Reactive airway disease with wheezing with acute exacerbation 03/12/2015     No current outpatient medications on file prior to visit.   No current facility-administered medications on file prior to visit.     Allergies  Allergen Reactions  . Lisinopril Anaphylaxis and Swelling    Social History   Socioeconomic History  . Marital status: Married    Spouse name: Not on file  . Number of children: 1  . Years of education: college  . Highest education level: Bachelor's degree (e.g., BA, AB, BS)  Occupational History  . Not on file  Social Needs  . Financial resource strain: Not on file  . Food insecurity    Worry: Not on file    Inability: Not on file  . Transportation needs    Medical: Not on file    Non-medical: Not on file  Tobacco Use  . Smoking status: Never Smoker  . Smokeless tobacco: Never Used  Substance and Sexual Activity  . Alcohol use: Yes    Comment: on special occasions  . Drug use: No  . Sexual activity: Yes    Birth control/protection: Pill  Lifestyle  . Physical activity    Days per week: Not on file    Minutes per session: Not  on file  . Stress: Not on file  Relationships  . Social Herbalist on phone: Not on file    Gets together: Not on file    Attends religious service: Not on file    Active member of club or organization: Not on file    Attends meetings of clubs or organizations: Not on file    Relationship status: Not on file  . Intimate partner violence    Fear of current or ex partner: Not on file    Emotionally abused: Not on file    Physically abused: Not on file    Forced sexual activity: Not on file  Other Topics Concern  . Not on file  Social History Narrative  . Not on file    Family History  Problem Relation Age of Onset  . Hypertension Mother   . Allergies Mother   . Diabetes Mother   . Breast  cancer Maternal Grandmother   . Thyroid disease Brother     Past Surgical History:  Procedure Laterality Date  . CESAREAN SECTION    . SLEEVE GASTROPLASTY    . TONSILLECTOMY      ROS: Review of Systems Negative except as stated above  PHYSICAL EXAM: BP 132/83   Pulse 78   Temp (!) 97.3 F (36.3 C) (Temporal)   Resp 17   Ht 5\' 4"  (1.626 m)   Wt 238 lb (108 kg)   SpO2 96%   BMI 40.85 kg/m   Physical Exam  General appearance - alert, well appearing, middle-aged obese African-American female and in no distress Mental status - normal mood, behavior, speech, dress, motor activity, and thought processes Eyes - pupils equal and reactive, extraocular eye movements intact Nose - normal and patent, no erythema, discharge or polyps Mouth - mucous membranes moist, pharynx normal without lesions Neck -thyroid gland appears slightly enlarged.  No thyroid nodules appreciated. Lymphatics -no cervical lymphadenopathy. Chest - clear to auscultation, no wheezes, rales or rhonchi, symmetric air entry Heart - normal rate, regular rhythm, normal S1, S2, no murmurs, rubs, clicks or gallops   CMP Latest Ref Rng & Units 03/13/2015 03/12/2015 03/12/2015  Glucose 65 - 99 mg/dL 149(H) 102(H) 117(H)  BUN 6 - 20 mg/dL 6 10 12   Creatinine 0.44 - 1.00 mg/dL 0.91 0.96 0.80  Sodium 135 - 145 mmol/L 137 139 144  Potassium 3.5 - 5.1 mmol/L 3.8 3.0(L) 2.6(LL)  Chloride 101 - 111 mmol/L 106 109 108  CO2 22 - 32 mmol/L 22 18(L) -  Calcium 8.9 - 10.3 mg/dL 8.9 8.9 -  Total Protein 6.5 - 8.1 g/dL 7.8 8.2(H) -  Total Bilirubin 0.3 - 1.2 mg/dL 0.6 0.5 -  Alkaline Phos 38 - 126 U/L 60 57 -  AST 15 - 41 U/L 22 24 -  ALT 14 - 54 U/L 10(L) 11(L) -   Lipid Panel  No results found for: CHOL, TRIG, HDL, CHOLHDL, VLDL, LDLCALC, LDLDIRECT  CBC    Component Value Date/Time   WBC 5.9 03/13/2015 0317   RBC 4.15 03/13/2015 0317   HGB 9.7 (L) 03/13/2015 0317   HCT 31.3 (L) 03/13/2015 0317   PLT 300 03/13/2015  0317   MCV 75.4 (L) 03/13/2015 0317   MCH 23.4 (L) 03/13/2015 0317   MCHC 31.0 03/13/2015 0317   RDW 13.6 03/13/2015 0317   LYMPHSABS 3.1 03/12/2015 2145   MONOABS 0.9 03/12/2015 2145   EOSABS 0.0 03/12/2015 2145   BASOSABS 0.0 03/12/2015 2145  ASSESSMENT AND PLAN: 1. Anterior neck pain -Of questionable etiology.  Could be a painless thyroiditis but not sure that it would be recurrent every several weeks.  No masses felt on exam today and throat does not appear inflamed..  We will plan to check TSH and get a thyroid ultrasound.  If these are negative next step would be to refer to ENT.  2. Thyroid enlarged - TSH - US THYROID; Future  3. Need for Tdap vaccination - Tdap vaccine greater than or equal to 7yo IM  4. Need for influenza vaccination - Flu Vaccine QUAD 6+ mos PF IM (Fluarix Quad PF)  5. Elevated blood pressure reading DASH diet discussed and encouraged.  We will plan to recheck blood pressure on next visit  6. History of anemia - CBC    Patient was given the opportunity to ask questions.  Patient verbalized understanding of the plan and was able to repeat key elements of the plan.   Orders Placed This Encounter  Procedures  . US THYROID  . Flu Vaccine QUAD 6+ mos PF IM (Fluarix Quad PF)  . Tdap vaccine greater than or equal to 7yo IM  . CBC  . TSH     Requested Prescriptions    No prescriptions requested or ordered in this encounter    Return in about 4 weeks (around 01/16/2019).  Jonah Blueeborah Johnson, MD, FACP

## 2018-12-20 LAB — CBC
Hematocrit: 34.9 % (ref 34.0–46.6)
Hemoglobin: 11 g/dL — ABNORMAL LOW (ref 11.1–15.9)
MCH: 24.3 pg — ABNORMAL LOW (ref 26.6–33.0)
MCHC: 31.5 g/dL (ref 31.5–35.7)
MCV: 77 fL — ABNORMAL LOW (ref 79–97)
Platelets: 367 10*3/uL (ref 150–450)
RBC: 4.53 x10E6/uL (ref 3.77–5.28)
RDW: 14.4 % (ref 11.7–15.4)
WBC: 6.3 10*3/uL (ref 3.4–10.8)

## 2018-12-20 LAB — TSH: TSH: 1.6 u[IU]/mL (ref 0.450–4.500)

## 2018-12-20 NOTE — Addendum Note (Signed)
Addended by: Karle Plumber B on: 12/20/2018 12:26 PM   Modules accepted: Orders

## 2018-12-21 LAB — IRON,TIBC AND FERRITIN PANEL
Ferritin: 29
Iron Bind.Cap.(Total): 284
Iron Saturation: 41
Iron: 116
UIBC: 168

## 2018-12-25 ENCOUNTER — Telehealth: Payer: Self-pay

## 2018-12-25 NOTE — Telephone Encounter (Signed)
Patient's thyroid ultrasound is scheduled at [facility] on 12/29/2018 @ 3:45 pm. She will go to first floor radiology.  Please notify her of appointment information.

## 2018-12-27 LAB — IRON,TIBC AND FERRITIN PANEL
Ferritin: 29 ng/mL (ref 15–150)
Iron Saturation: 41 % (ref 15–55)
Iron: 116 ug/dL (ref 27–159)
Total Iron Binding Capacity: 284 ug/dL (ref 250–450)
UIBC: 168 ug/dL (ref 131–425)

## 2018-12-27 LAB — SPECIMEN STATUS REPORT

## 2018-12-27 NOTE — Progress Notes (Signed)
Patient notified of results & recommendations. Expressed understanding.

## 2018-12-29 ENCOUNTER — Ambulatory Visit (HOSPITAL_COMMUNITY): Payer: 59

## 2019-01-16 ENCOUNTER — Ambulatory Visit: Payer: 59

## 2019-01-23 ENCOUNTER — Ambulatory Visit: Payer: 59

## 2019-08-23 LAB — HM MAMMOGRAPHY

## 2019-08-27 ENCOUNTER — Other Ambulatory Visit: Payer: Self-pay | Admitting: Obstetrics

## 2019-08-27 DIAGNOSIS — R928 Other abnormal and inconclusive findings on diagnostic imaging of breast: Secondary | ICD-10-CM

## 2019-08-31 ENCOUNTER — Ambulatory Visit
Admission: RE | Admit: 2019-08-31 | Discharge: 2019-08-31 | Disposition: A | Discharge status: Home / Self Care | Payer: 59 | Source: Ambulatory Visit | Attending: Obstetrics | Admitting: Obstetrics

## 2019-08-31 ENCOUNTER — Other Ambulatory Visit: Payer: Self-pay

## 2019-08-31 DIAGNOSIS — R928 Other abnormal and inconclusive findings on diagnostic imaging of breast: Secondary | ICD-10-CM

## 2019-09-17 ENCOUNTER — Ambulatory Visit (INDEPENDENT_AMBULATORY_CARE_PROVIDER_SITE_OTHER): Payer: No Typology Code available for payment source

## 2019-09-17 ENCOUNTER — Encounter: Payer: Self-pay | Admitting: Internal Medicine

## 2019-09-17 ENCOUNTER — Other Ambulatory Visit: Payer: Self-pay

## 2019-09-17 ENCOUNTER — Telehealth (INDEPENDENT_AMBULATORY_CARE_PROVIDER_SITE_OTHER): Payer: No Typology Code available for payment source | Admitting: Internal Medicine

## 2019-09-17 DIAGNOSIS — R070 Pain in throat: Secondary | ICD-10-CM | POA: Diagnosis not present

## 2019-09-17 DIAGNOSIS — M542 Cervicalgia: Secondary | ICD-10-CM | POA: Diagnosis not present

## 2019-09-17 NOTE — Progress Notes (Signed)
Patient here for soft tissue xray.

## 2019-09-17 NOTE — Progress Notes (Signed)
Virtual Visit via Telephone Note  I connected with Connie Foster, on 09/17/2019 at 9:56 AM by telephone due to the COVID-19 pandemic and verified that I am speaking with the correct person using two identifiers.   Consent: I discussed the limitations, risks, security and privacy concerns of performing an evaluation and management service by telephone and the availability of in person appointments. I also discussed with the patient that there may be a patient responsible charge related to this service. The patient expressed understanding and agreed to proceed.   Location of Patient: Home   Location of Provider: Clinic    Persons participating in Telemedicine visit: Connie Foster Dr. Earlene Plater   History of Present Illness: Patient reports concerns about a "strangulation" sensation where her breath is cut off at the base of her throat. Its not painful but is very, very uncomfortable. No trouble with swallowing liquids or solids. No cough, rhinorrhea, nasal congestion. No changes in voice. Does feel strain if she talks too long. Afebrile. This occurs every single day. Sensation of something sitting "there" but not in her throat because she can swallow just fine. Perfect posture of sitting straight up seems to avoid this from occurring. If she needs to look forward at her phone it seems to make it happen. She can not palpate any lump or visualize anything. No injury to the area. Has been occurring for 2-2.5 months and progressively getting worse in terms of occurring more frequently and episodes lasting longer.    Past Medical History:  Diagnosis Date  . History of hypertension    Allergies  Allergen Reactions  . Lisinopril Anaphylaxis and Swelling    Current Outpatient Medications on File Prior to Visit  Medication Sig Dispense Refill  . levonorgestrel (MIRENA, 52 MG,) 20 MCG/24HR IUD Mirena 20 mcg/24 hours (6 yrs) 52 mg intrauterine device  Take 1 device by  intrauterine route.     No current facility-administered medications on file prior to visit.    Observations/Objective: NAD. Speaking clearly.  Work of breathing normal.  Alert and oriented. Mood appropriate.   Assessment and Plan: 1. Throat pain Differential remains broad. Will obtain xray of neck. Apparently from chart review, patient has brought up concerns of this in the past and TSH and thyroid ultrasound were planned. TSH was normal at that time. Thyroid ultrasound was never performed. Will schedule patient for this. If normal, will plan to refer to ENT. Could consider trial of PPI in the meantime as well.  - DG Neck Soft Tissue; Future   Follow Up Instructions: Xray and ultrasound imaging    I discussed the assessment and treatment plan with the patient. The patient was provided an opportunity to ask questions and all were answered. The patient agreed with the plan and demonstrated an understanding of the instructions.   The patient was advised to call back or seek an in-person evaluation if the symptoms worsen or if the condition fails to improve as anticipated.     I provided 22 minutes total of non-face-to-face time during this encounter including median intraservice time, reviewing previous notes, investigations, ordering medications, medical decision making, coordinating care and patient verbalized understanding at the end of the visit.    Marcy Siren, D.O. Primary Care at [facility]  09/17/2019, 9:56 AM

## 2019-09-19 NOTE — Progress Notes (Signed)
Patient notified of results & recommendations. Expressed understanding.

## 2020-08-23 IMAGING — US US BREAST*L* LIMITED INC AXILLA
1 series · 10 of 10 positions shown · non-contrast
Comparison: August 23, 2019

CLINICAL DATA: 38-year-old patient recalled from recent baseline
screening mammogram for evaluation a possible mass left breast.

EXAM:
DIGITAL DIAGNOSTIC LEFT MAMMOGRAM WITH CAD AND TOMO
ULTRASOUND LEFT BREAST

[Series 1: us breast*left* limited inc axilla · 0.06mm/px · 10 of 10 slices shown]
[im 1/10]
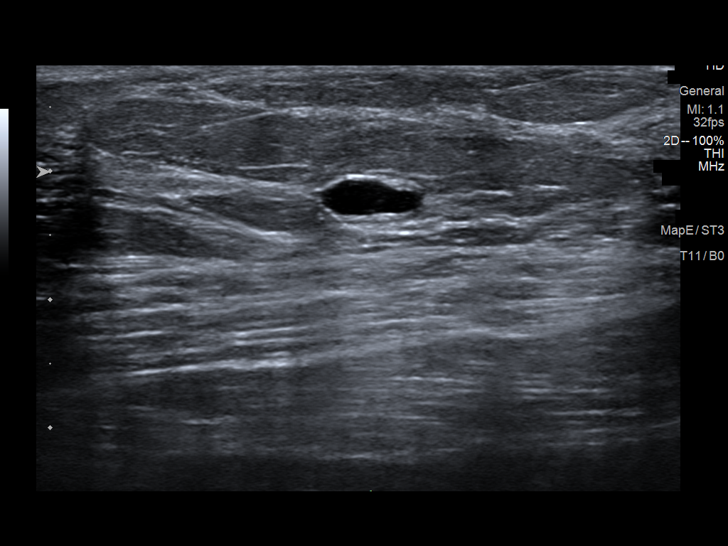
[im 2/10]
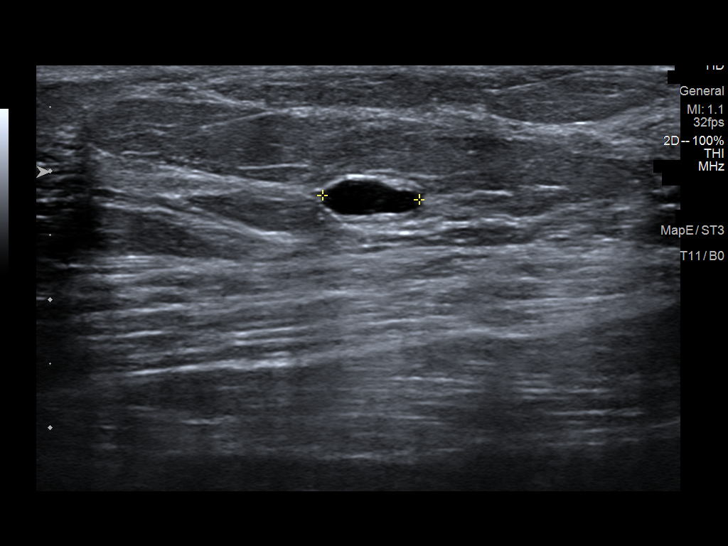
[im 3/10]
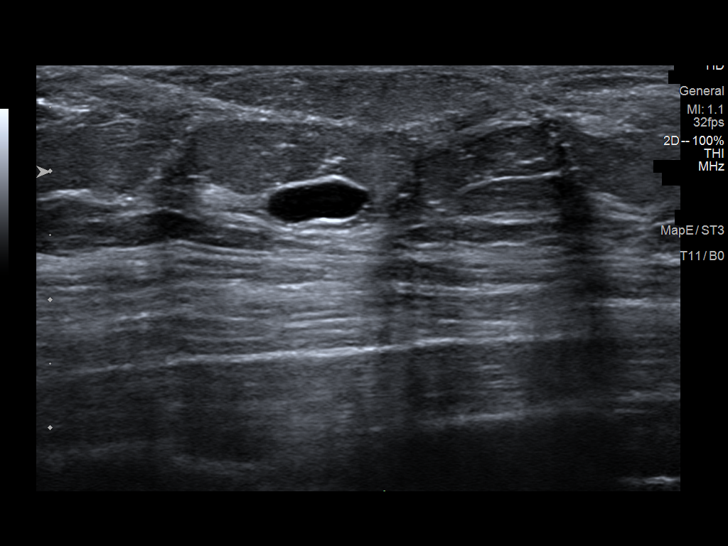
[im 4/10]
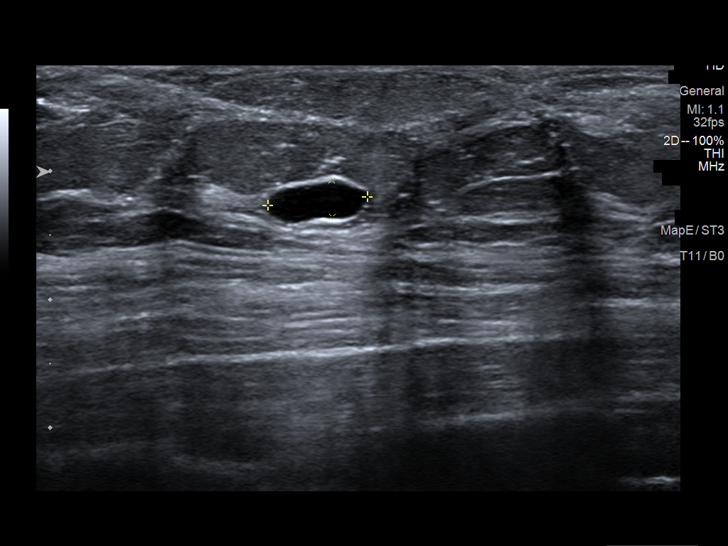
[im 5/10]
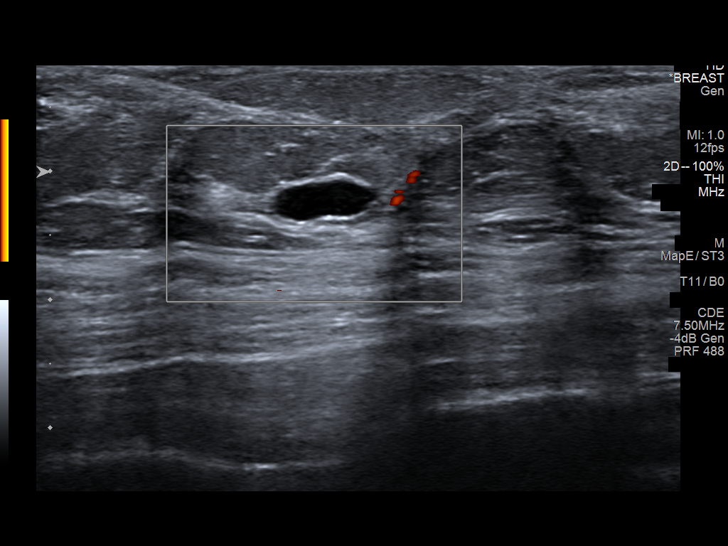
[im 6/10]
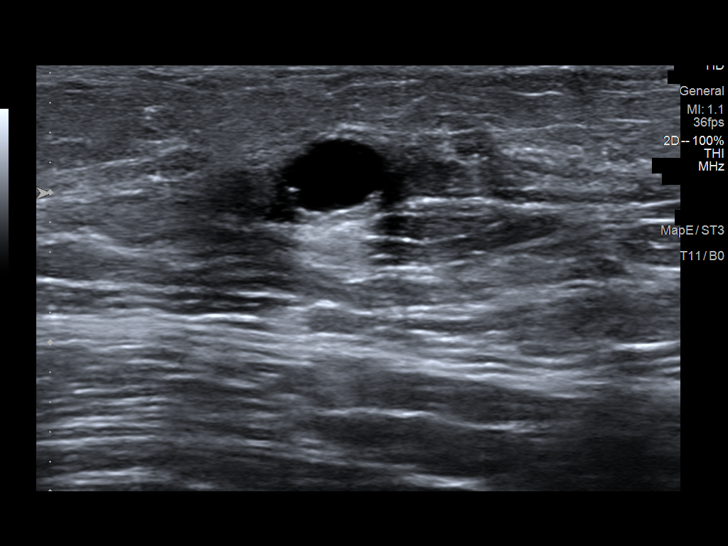
[im 7/10]
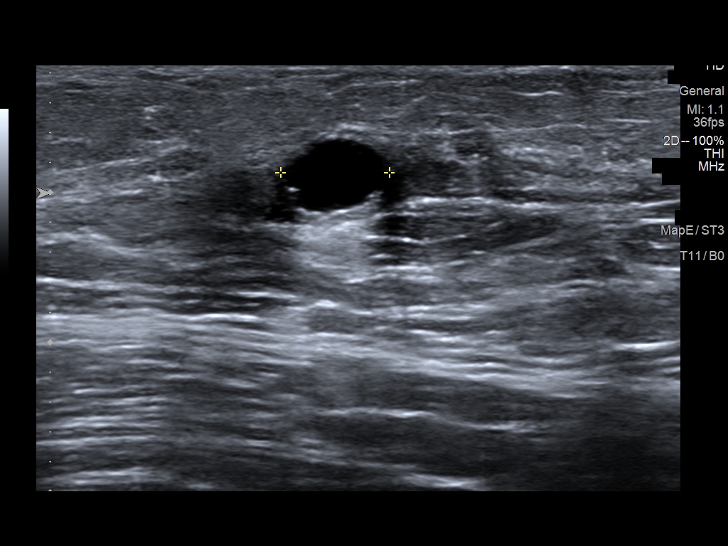
[im 8/10]
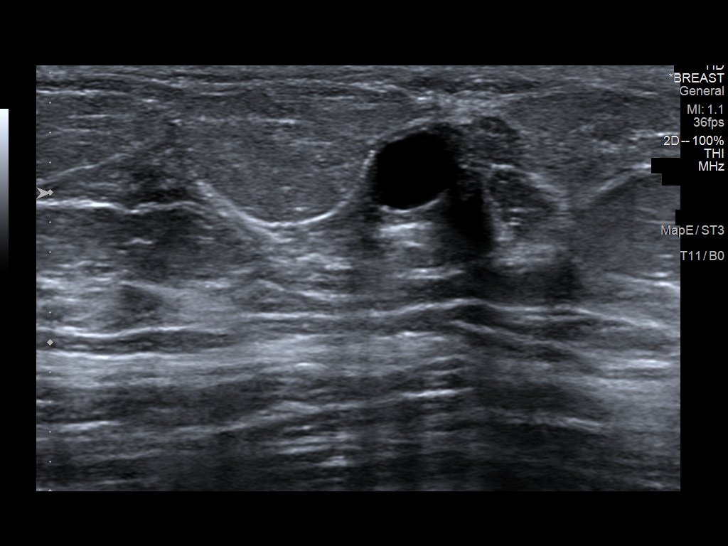
[im 9/10]
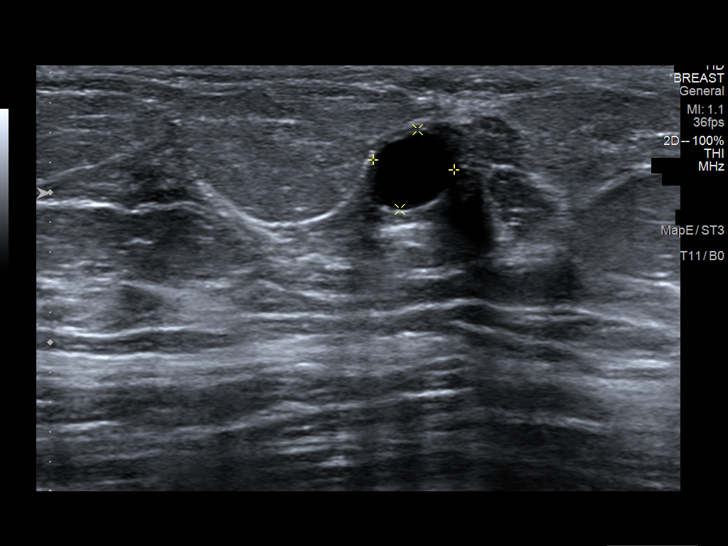
[im 10/10]
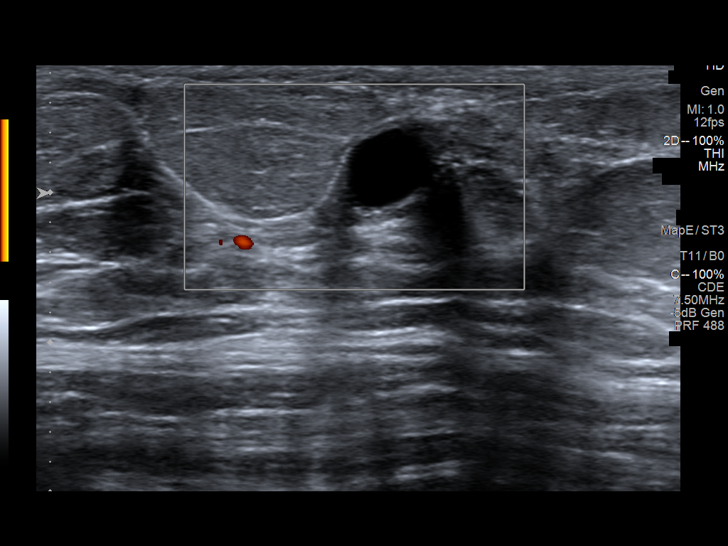

[10 of 10 positions shown; findings below may reference images not displayed]

ACR Breast Density Category b: There are scattered areas of
fibroglandular density.
FINDINGS: Compression views of left breast performed today confirm an oval
circumscribed mass in the upper outer quadrant. No mass is
identified in the central left breast.

Mammographic images were processed with CAD.

Targeted ultrasound is performed, showing an oval simple cyst at
[DATE] position 7 cm from the nipple measuring 0.8 x 0.3 x 0 point 8
cm. This accounts for the mass seen in the MLO projection of the
recent screening mammogram, and shown on today's additional views to
be in the outer left breast.

On today's ultrasound, a second simple cyst is identified at 1
o'clock position 5 cm from nipple measuring 0.7 x 0.5 x 0.6 cm. No
suspicious masses identified.
IMPRESSION: Two benign simple cysts are identified in the left breast. No
suspicious findings identified.

RECOMMENDATION:
Screening mammogram in one year.(Code:VA-A-4P5)

I have discussed the findings and recommendations with the patient.
If applicable, a reminder letter will be sent to the patient
regarding the next appointment.

BI-RADS CATEGORY  2: Benign.

## 2021-03-06 ENCOUNTER — Other Ambulatory Visit: Payer: Self-pay

## 2021-03-06 ENCOUNTER — Ambulatory Visit
Admission: EM | Admit: 2021-03-06 | Discharge: 2021-03-06 | Disposition: A | Discharge status: Home / Self Care | Payer: PRIVATE HEALTH INSURANCE | Attending: Internal Medicine | Admitting: Internal Medicine

## 2021-03-06 DIAGNOSIS — Z20822 Contact with and (suspected) exposure to covid-19: Secondary | ICD-10-CM | POA: Insufficient documentation

## 2021-03-06 DIAGNOSIS — N3001 Acute cystitis with hematuria: Secondary | ICD-10-CM | POA: Insufficient documentation

## 2021-03-06 DIAGNOSIS — R42 Dizziness and giddiness: Secondary | ICD-10-CM | POA: Diagnosis present

## 2021-03-06 DIAGNOSIS — R110 Nausea: Secondary | ICD-10-CM | POA: Diagnosis not present

## 2021-03-06 LAB — POCT URINALYSIS DIP (MANUAL ENTRY)
Bilirubin, UA: NEGATIVE
Glucose, UA: NEGATIVE mg/dL
Ketones, POC UA: NEGATIVE mg/dL
Nitrite, UA: NEGATIVE
Protein Ur, POC: NEGATIVE mg/dL
Spec Grav, UA: 1.015 (ref 1.010–1.025)
Urobilinogen, UA: 2 E.U./dL — AB
pH, UA: 7 (ref 5.0–8.0)

## 2021-03-06 LAB — POCT URINE PREGNANCY: Preg Test, Ur: NEGATIVE

## 2021-03-06 MED ORDER — NITROFURANTOIN MONOHYD MACRO 100 MG PO CAPS: 100.0000 mg | ORAL_CAPSULE | Freq: Two times a day (BID) | ORAL | 0 refills | Status: DC

## 2021-03-06 MED ORDER — ONDANSETRON 4 MG PO TBDP: 4.0000 mg | ORAL_TABLET | Freq: Three times a day (TID) | ORAL | 0 refills | Status: DC | PRN

## 2021-03-06 NOTE — ED Triage Notes (Signed)
Pt c/o nausea, dizziness onset last night.

## 2021-03-06 NOTE — Discharge Instructions (Signed)
Your urine is showing signs of urinary tract infection which may be causing your nausea and dizziness.  Your urine culture is pending.  You have been prescribed an antibiotic to treat this.  COVID test is also pending.  We will call if it is positive.

## 2021-03-06 NOTE — ED Provider Notes (Signed)
[facility]    CSN: [id number] Arrival date & time: 03/06/21  0946      History   Chief Complaint Chief Complaint  Patient presents with   nausea dizzy    HPI Connie Foster is a 40 y.o. female.   Patient presents with nausea without vomiting and constant dizziness that started last night.  Patient denies vomiting, diarrhea, abdominal pain, any upper respiratory symptoms, fever.  Denies any known sick contacts.  Patient has been able to drink fluids.  Denies any chance of pregnancy as she has IUD in place.  Although, patient reports that she has irregular menstrual cycle since IUD was placed and does not know when last menstrual cycle occurred.  Denies chest pain or shortness of breath.  Denies urinary burning, urinary frequency, vaginal discharge, hematuria.  Denies constipation.  No aggravating factors to nausea or dizziness as they are both persistent and constant.    Past Medical History:  Diagnosis Date   History of hypertension     Patient Active Problem List   Diagnosis Date Noted   Lung nodule    Pleurodynia    Chest pain 03/12/2015   Dyspnea 03/12/2015   Hypokalemia 03/12/2015   Reactive airway disease with wheezing with acute exacerbation 03/12/2015    Past Surgical History:  Procedure Laterality Date   CESAREAN SECTION     SLEEVE GASTROPLASTY     TONSILLECTOMY      OB History   No obstetric history on file.      Home Medications    Prior to Admission medications   Medication Sig Start Date End Date Taking? Authorizing Provider  nitrofurantoin, macrocrystal-monohydrate, (MACROBID) 100 MG capsule Take 1 capsule (100 mg total) by mouth 2 (two) times daily. 03/06/21  Yes , Rolly Salter E, FNP  ondansetron (ZOFRAN-ODT) 4 MG disintegrating tablet Take 1 tablet (4 mg total) by mouth every 8 (eight) hours as needed for nausea or vomiting. 03/06/21  Yes Gustavus Bryant, FNP  levonorgestrel (MIRENA, 52 MG,) 20 MCG/24HR IUD Mirena 20 mcg/24 hours  (6 yrs) 52 mg intrauterine device  Take 1 device by intrauterine route.    [provider]    Family History Family History  Problem Relation Age of Onset   Hypertension Mother    Allergies Mother    Diabetes Mother    Breast cancer Maternal Grandmother    Thyroid disease Brother     Social History Social History   Tobacco Use   Smoking status: Never   Smokeless tobacco: Never  Vaping Use   Vaping Use: Never used  Substance Use Topics   Alcohol use: Yes    Comment: on special occasions   Drug use: No     Allergies   Lisinopril   Review of Systems Review of Systems Per HPI  Physical Exam Triage Vital Signs ED Triage Vitals  Enc Vitals Group     BP 03/06/21 1014 123/88     Pulse Rate 03/06/21 1014 77     Resp 03/06/21 1014 18     Temp 03/06/21 1014 98.1 F (36.7 C)     Temp Source 03/06/21 1014 Oral     SpO2 03/06/21 1014 99 %     Weight --      Height --      Head Circumference --      Peak Flow --      Pain Score 03/06/21 1015 0     Pain Loc --      Pain  Edu? --      Excl. in [place]? --    No data found.  Updated Vital Signs BP 123/88 (BP Location: Right Arm)    Pulse 77    Temp 98.1 F (36.7 C) (Oral)    Resp 18    SpO2 99%   Visual Acuity Right Eye Distance:   Left Eye Distance:   Bilateral Distance:    Right Eye Near:   Left Eye Near:    Bilateral Near:     Physical Exam Constitutional:      General: She is not in acute distress.    Appearance: Normal appearance. She is not toxic-appearing or diaphoretic.  HENT:     Head: Normocephalic and atraumatic.     Right Ear: Tympanic membrane and ear canal normal.     Left Ear: Tympanic membrane and ear canal normal.     Nose: Nose normal.     Mouth/Throat:     Mouth: Mucous membranes are moist.     Pharynx: No posterior oropharyngeal erythema.  Eyes:     Extraocular Movements: Extraocular movements intact.     Conjunctiva/sclera: Conjunctivae normal.     Pupils: Pupils are equal,  round, and reactive to light.  Cardiovascular:     Rate and Rhythm: Normal rate and regular rhythm.     Pulses: Normal pulses.     Heart sounds: Normal heart sounds.  Pulmonary:     Effort: Pulmonary effort is normal. No respiratory distress.     Breath sounds: Normal breath sounds.  Abdominal:     General: Bowel sounds are normal. There is no distension.     Palpations: Abdomen is soft.     Tenderness: There is no abdominal tenderness.  Skin:    General: Skin is warm and dry.  Neurological:     General: No focal deficit present.     Mental Status: She is alert and oriented to person, place, and time. Mental status is at baseline.     Cranial Nerves: Cranial nerves 2-12 are intact.     Sensory: Sensation is intact.     Motor: Motor function is intact.     Coordination: Coordination is intact.     Gait: Gait is intact.  Psychiatric:        Mood and Affect: Mood normal.        Behavior: Behavior normal.        Thought Content: Thought content normal.        Judgment: Judgment normal.     UC Treatments / Results  Labs (all labs ordered are listed, but only abnormal results are displayed) Labs Reviewed  POCT URINALYSIS DIP (MANUAL ENTRY) - Abnormal; Notable for the following components:      Result Value   Blood, UA trace-intact (*)    Urobilinogen, UA 2.0 (*)    Leukocytes, UA Small (1+) (*)    All other components within normal limits  NOVEL CORONAVIRUS, NAA  URINE CULTURE  POCT URINE PREGNANCY    EKG   Radiology No results found.  Procedures Procedures (including critical care time)  Medications Ordered in UC Medications - No data to display  Initial Impression / Assessment and Plan / UC Course  I have reviewed the triage vital signs and the nursing notes.  Pertinent labs & imaging results that were available during my care of the patient were reviewed by me and considered in my medical decision making (see chart for details).     Urinalysis is showing  signs of urinary tract infection with small amount of leukocytes.  This may be cause of patient's nausea and dizziness.  EKG, COVID-19 test, urine pregnancy test completed to rule out other etiologies and to ensure that urinalysis is not incidental finding.  EKG was normal sinus rhythm.  COVID-19 test is pending.  Urine pregnancy was negative.  Will treat with Macrobid x5 days.  Ondansetron to take as needed for nausea as well.  Patient to increase clear oral fluid intake.  Neuro exam normal.  Discussed strict return precautions.  Patient verbalized understanding and was agreeable with plan. Final Clinical Impressions(s) / UC Diagnoses   Final diagnoses:  Nausea without vomiting  Acute cystitis with hematuria  Dizziness and giddiness  Encounter for laboratory testing for COVID-19 virus     Discharge Instructions      Your urine is showing signs of urinary tract infection which may be causing your nausea and dizziness.  Your urine culture is pending.  You have been prescribed an antibiotic to treat this.  COVID test is also pending.  We will call if it is positive.     ED Prescriptions     Medication Sig Dispense Auth. Provider   nitrofurantoin, macrocrystal-monohydrate, (MACROBID) 100 MG capsule Take 1 capsule (100 mg total) by mouth 2 (two) times daily. 10 capsule Oswaldo Conroy E, FNP   ondansetron (ZOFRAN-ODT) 4 MG disintegrating tablet Take 1 tablet (4 mg total) by mouth every 8 (eight) hours as needed for nausea or vomiting. 20 tablet [place], Michele Rockers, [place]      PDMP not reviewed this encounter.   Teodora Medici, [place] 03/06/21 1055

## 2021-03-07 LAB — SARS-COV-2, NAA 2 DAY TAT

## 2021-03-07 LAB — NOVEL CORONAVIRUS, NAA: SARS-CoV-2, NAA: NOT DETECTED

## 2021-03-08 LAB — URINE CULTURE: Culture: NO GROWTH

## 2021-04-12 NOTE — Progress Notes (Signed)
Patient ID: Connie Foster, female    DOB: August 27, 1980  MRN: [id number]  CC: Annual Physical Exam  Subjective: Connie Foster is a 41 y.o. female who presents for annual physical exam.   Her concerns today include:  None.  Patient Active Problem List   Diagnosis Date Noted   Lung nodule    Pleurodynia    Chest pain 03/12/2015   Dyspnea 03/12/2015   Hypokalemia 03/12/2015   Reactive airway disease with wheezing with acute exacerbation 03/12/2015     Current Outpatient Medications on File Prior to Visit  Medication Sig Dispense Refill   levonorgestrel (MIRENA, 52 MG,) 20 MCG/24HR IUD Mirena 20 mcg/24 hours (6 yrs) 52 mg intrauterine device  Take 1 device by intrauterine route.     ondansetron (ZOFRAN-ODT) 4 MG disintegrating tablet Take 1 tablet (4 mg total) by mouth every 8 (eight) hours as needed for nausea or vomiting. 20 tablet 0   No current facility-administered medications on file prior to visit.    Allergies  Allergen Reactions   Lisinopril Anaphylaxis and Swelling    Social History   Socioeconomic History   Marital status: Married    Spouse name: Not on file   Number of children: 1   Years of education: college   Highest education level: Bachelor's degree (e.g., BA, AB, BS)  Occupational History   Not on file  Tobacco Use   Smoking status: Never   Smokeless tobacco: Never  Vaping Use   Vaping Use: Never used  Substance and Sexual Activity   Alcohol use: Yes    Comment: on special occasions   Drug use: No   Sexual activity: Yes    Birth control/protection: Pill  Other Topics Concern   Not on file  Social History Narrative   Not on file   Social Determinants of Health   Financial Resource Strain: Not on file  Food Insecurity: Not on file  Transportation Needs: Not on file  Physical Activity: Not on file  Stress: Not on file  Social Connections: Not on file  Intimate Partner Violence: Not on file    Family History  Problem Relation  Age of Onset   Hypertension Mother    Allergies Mother    Diabetes Mother    Breast cancer Maternal Grandmother    Thyroid disease Brother     Past Surgical History:  Procedure Laterality Date   CESAREAN SECTION     SLEEVE GASTROPLASTY     TONSILLECTOMY      ROS: Review of Systems Negative except as stated above  PHYSICAL EXAM: BP 113/67 (BP Location: Left Arm, Patient Position: Sitting, Cuff Size: Large)    Pulse 79    Temp 98.5 F (36.9 C)    Resp 18    Ht 5' 5.75" (1.67 m)    Wt 220 lb (99.8 kg)    SpO2 98%    BMI 35.78 kg/m   Physical Exam HENT:     Head: Normocephalic and atraumatic.     Right Ear: Tympanic membrane, ear canal and external ear normal.     Left Ear: Tympanic membrane, ear canal and external ear normal.  Eyes:     Extraocular Movements: Extraocular movements intact.     Conjunctiva/sclera: Conjunctivae normal.     Pupils: Pupils are equal, round, and reactive to light.  Cardiovascular:     Rate and Rhythm: Normal rate and regular rhythm.     Pulses: Normal pulses.     Heart sounds: Normal heart  sounds.  Pulmonary:     Effort: Pulmonary effort is normal.     Breath sounds: Normal breath sounds.  Chest:     Comments: Patient declined exam.  Abdominal:     General: Bowel sounds are normal.     Palpations: Abdomen is soft.  Genitourinary:    Comments: Patient declined exam.  Musculoskeletal:        General: Normal range of motion.     Cervical back: Normal range of motion and neck supple.  Skin:    General: Skin is warm and dry.     Capillary Refill: Capillary refill takes less than 2 seconds.  Neurological:     General: No focal deficit present.     Mental Status: She is alert and oriented to person, place, and time.  Psychiatric:        Mood and Affect: Mood normal.        Behavior: Behavior normal.   ASSESSMENT AND PLAN: 1. Annual physical exam: - Counseled on 150 minutes of exercise per week as tolerated, healthy eating (including  decreased daily intake of saturated fats, cholesterol, added sugars, sodium), STI prevention, and routine healthcare maintenance.  2. Screening for metabolic disorder: - OQH47+MLYY to check kidney function, liver function, and electrolyte balance.  - CMP14+EGFR  3. Screening for deficiency anemia: - CBC to screen for anemia. - CBC  4. Diabetes mellitus screening: - Hemoglobin A1c to screen for pre-diabetes/diabetes. - Hemoglobin A1c  5. Screening cholesterol level: - Lipid panel to screen for high cholesterol.  - Lipid panel  6. Thyroid disorder screen: - TSH to check thyroid function.  - TSH  7. Encounter for screening mammogram for malignant neoplasm of breast: - Patient declined reporting up to date.   8. Pap smear for cervical cancer screening: 9. Routine screening for STI (sexually transmitted infection): - Patient declined reporting up to date.   10. Encounter for screening for HIV: - HIV antibody to screen for human immunodeficiency virus.  - HIV antibody (with reflex)  11. Need for hepatitis C screening test: - Hepatitis C antibody to screen for hepatitis C.  - Hepatitis C Antibody   Patient was given the opportunity to ask questions.  Patient verbalized understanding of the plan and was able to repeat key elements of the plan. Patient was given clear instructions to go to Emergency Department or return to medical center if symptoms don't improve, worsen, or new problems develop.The patient verbalized understanding.   Orders Placed This Encounter  Procedures   HIV antibody (with reflex)   Hepatitis C Antibody   CBC   Lipid panel   TSH   CMP14+EGFR   Hemoglobin A1c    Follow-up with primary provider as scheduled.   Camillia Herter, NP

## 2021-04-17 ENCOUNTER — Ambulatory Visit (INDEPENDENT_AMBULATORY_CARE_PROVIDER_SITE_OTHER): Payer: PRIVATE HEALTH INSURANCE | Admitting: Family

## 2021-04-17 ENCOUNTER — Encounter: Payer: Self-pay | Admitting: Family

## 2021-04-17 ENCOUNTER — Other Ambulatory Visit: Payer: Self-pay

## 2021-04-17 VITALS — BP 113/67 | HR 79 | Temp 98.5°F | Resp 18 | Ht 65.75 in | Wt 220.0 lb

## 2021-04-17 DIAGNOSIS — Z0000 Encounter for general adult medical examination without abnormal findings: Secondary | ICD-10-CM | POA: Diagnosis not present

## 2021-04-17 DIAGNOSIS — Z131 Encounter for screening for diabetes mellitus: Secondary | ICD-10-CM

## 2021-04-17 DIAGNOSIS — Z130 Encounter for screening for diseases of the blood and blood-forming organs and certain disorders involving the immune mechanism: Secondary | ICD-10-CM | POA: Diagnosis not present

## 2021-04-17 DIAGNOSIS — Z13228 Encounter for screening for other metabolic disorders: Secondary | ICD-10-CM

## 2021-04-17 DIAGNOSIS — Z113 Encounter for screening for infections with a predominantly sexual mode of transmission: Secondary | ICD-10-CM

## 2021-04-17 DIAGNOSIS — Z1231 Encounter for screening mammogram for malignant neoplasm of breast: Secondary | ICD-10-CM

## 2021-04-17 DIAGNOSIS — Z1329 Encounter for screening for other suspected endocrine disorder: Secondary | ICD-10-CM

## 2021-04-17 DIAGNOSIS — Z13220 Encounter for screening for lipoid disorders: Secondary | ICD-10-CM

## 2021-04-17 DIAGNOSIS — Z1159 Encounter for screening for other viral diseases: Secondary | ICD-10-CM

## 2021-04-17 DIAGNOSIS — Z124 Encounter for screening for malignant neoplasm of cervix: Secondary | ICD-10-CM

## 2021-04-17 DIAGNOSIS — Z114 Encounter for screening for human immunodeficiency virus [HIV]: Secondary | ICD-10-CM

## 2021-04-17 NOTE — Progress Notes (Signed)
Pt presents for annual physical exam, mammogram and pap completed 02/23/21

## 2021-04-17 NOTE — Patient Instructions (Signed)

## 2021-04-18 ENCOUNTER — Other Ambulatory Visit: Payer: Self-pay | Admitting: Family

## 2021-04-18 DIAGNOSIS — Z130 Encounter for screening for diseases of the blood and blood-forming organs and certain disorders involving the immune mechanism: Secondary | ICD-10-CM

## 2021-04-18 LAB — CMP14+EGFR
ALT: 28 IU/L (ref 0–32)
AST: 43 IU/L — ABNORMAL HIGH (ref 0–40)
Albumin/Globulin Ratio: 0.9 — ABNORMAL LOW (ref 1.2–2.2)
Albumin: 4.2 g/dL (ref 3.8–4.8)
Alkaline Phosphatase: 121 IU/L (ref 44–121)
BUN/Creatinine Ratio: 10 (ref 9–23)
BUN: 10 mg/dL (ref 6–24)
Bilirubin Total: 0.5 mg/dL (ref 0.0–1.2)
CO2: 21 mmol/L (ref 20–29)
Calcium: 8.7 mg/dL (ref 8.7–10.2)
Chloride: 107 mmol/L — ABNORMAL HIGH (ref 96–106)
Creatinine, Ser: 1 mg/dL (ref 0.57–1.00)
Globulin, Total: 4.6 g/dL — ABNORMAL HIGH (ref 1.5–4.5)
Glucose: 81 mg/dL (ref 70–99)
Potassium: 4.2 mmol/L (ref 3.5–5.2)
Sodium: 139 mmol/L (ref 134–144)
Total Protein: 8.8 g/dL — ABNORMAL HIGH (ref 6.0–8.5)
eGFR: 72 mL/min/{1.73_m2} (ref >59)

## 2021-04-18 LAB — CBC
Hematocrit: 33.8 % — ABNORMAL LOW (ref 34.0–46.6)
Hemoglobin: 10.7 g/dL — ABNORMAL LOW (ref 11.1–15.9)
MCH: 24.4 pg — ABNORMAL LOW (ref 26.6–33.0)
MCHC: 31.7 g/dL (ref 31.5–35.7)
MCV: 77 fL — ABNORMAL LOW (ref 79–97)
Platelets: 278 10*3/uL (ref 150–450)
RBC: 4.39 x10E6/uL (ref 3.77–5.28)
RDW: 14.5 % (ref 11.7–15.4)
WBC: 4.3 10*3/uL (ref 3.4–10.8)

## 2021-04-18 LAB — LIPID PANEL
Chol/HDL Ratio: 2.6 ratio (ref 0.0–4.4)
Cholesterol, Total: 137 mg/dL (ref 100–199)
HDL: 52 mg/dL (ref >39)
LDL Chol Calc (NIH): 74 mg/dL (ref 0–99)
Triglycerides: 47 mg/dL (ref 0–149)
VLDL Cholesterol Cal: 11 mg/dL (ref 5–40)

## 2021-04-18 LAB — HEMOGLOBIN A1C
Est. average glucose Bld gHb Est-mCnc: 97 mg/dL
Hgb A1c MFr Bld: 5 % (ref 4.8–5.6)

## 2021-04-18 LAB — HEPATITIS C ANTIBODY: Hep C Virus Ab: 0.1 s/co ratio (ref 0.0–0.9)

## 2021-04-18 LAB — HIV ANTIBODY (ROUTINE TESTING W REFLEX): HIV Screen 4th Generation wRfx: NONREACTIVE

## 2021-04-18 LAB — TSH: TSH: 4.62 u[IU]/mL — ABNORMAL HIGH (ref 0.450–4.500)

## 2021-04-18 NOTE — Progress Notes (Signed)
Kidney function normal.   Cholesterol normal.   No diabetes.   Hepatitis C negative.  HIV negative.   Adding iron panel to further screen for anemia.  Thyroid function mildly higher than normal. Encouraged to recheck in 6 weeks.  AST elevated. This enzyme is used to check liver function.   Some causes of an elevation of AST may be increased alcohol consumption, high-fat diet, or overuse of NSAID's such as Ibuprofen, Aleve, Motrin, and Naproxen. If any of these apply consider limiting use. Encouraged to recheck liver function in 4 to 6 weeks.

## 2021-04-21 LAB — IRON,TIBC AND FERRITIN PANEL
Ferritin: 33 ng/mL (ref 15–150)
Iron Saturation: 22 % (ref 15–55)
Iron: 71 ug/dL (ref 27–159)
Total Iron Binding Capacity: 330 ug/dL (ref 250–450)
UIBC: 259 ug/dL (ref 131–425)

## 2021-04-21 LAB — SPECIMEN STATUS REPORT

## 2021-04-21 NOTE — Progress Notes (Signed)
Iron panel normal. Reminder to increase iron-rich foods in the diet such as green leafy vegetables, apricots, and raisins. Also, may try over-the-counter iron supplement.

## 2021-09-24 NOTE — Progress Notes (Signed)
Patient ID: Connie Foster, female    DOB: 07-21-1980  MRN: [id number]  CC: Frequent Urination   Subjective: Connie Foster is a 41 y.o. female who presents for frequent urination.   Her concerns today include:  Frequent urination with increased thirst persisting for weeks. Frequent urination interrupting sleep.Denies any change in urine odor or color. IUD in place and established with Gynecology. Drinking plenty of water. Not eating much due to nausea without vomiting. Reports head feels like walking underwater. Thinks this may be related to history of lupus. Diagnosed 15 years ago and not established with specialist. Denies red flag symptoms such as but not limited to chest pain, shortness of breath, respiratory symptoms, worst headache of life, change in vision/hearing/speech, dizziness, imbalance, abdominal pain. She is able to complete activities of daily living within reason.   Patient Active Problem List   Diagnosis Date Noted   Lung nodule    Pleurodynia    Chest pain 03/12/2015   Dyspnea 03/12/2015   Hypokalemia 03/12/2015   Reactive airway disease with wheezing with acute exacerbation 03/12/2015     Current Outpatient Medications on File Prior to Visit  Medication Sig Dispense Refill   levonorgestrel (MIRENA, 52 MG,) 20 MCG/24HR IUD Mirena 20 mcg/24 hours (6 yrs) 52 mg intrauterine device  Take 1 device by intrauterine route.     No current facility-administered medications on file prior to visit.    Allergies  Allergen Reactions   Lisinopril Anaphylaxis and Swelling    Social History   Socioeconomic History   Marital status: Married    Spouse name: Not on file   Number of children: 1   Years of education: college   Highest education level: Bachelor's degree (e.g., BA, AB, BS)  Occupational History   Not on file  Tobacco Use   Smoking status: Never   Smokeless tobacco: Never  Vaping Use   Vaping Use: Never used  Substance and Sexual Activity    Alcohol use: Yes    Comment: on special occasions   Drug use: No   Sexual activity: Yes    Birth control/protection: Pill  Other Topics Concern   Not on file  Social History Narrative   Not on file   Social Determinants of Health   Financial Resource Strain: Not on file  Food Insecurity: Not on file  Transportation Needs: Not on file  Physical Activity: Not on file  Stress: Not on file  Social Connections: Not on file  Intimate Partner Violence: Not on file    Family History  Problem Relation Age of Onset   Hypertension Mother    Allergies Mother    Diabetes Mother    Breast cancer Maternal Grandmother    Thyroid disease Brother     Past Surgical History:  Procedure Laterality Date   CESAREAN SECTION     SLEEVE GASTROPLASTY     TONSILLECTOMY      ROS: Review of Systems Negative except as stated above  PHYSICAL EXAM: BP 116/79   Pulse 90   Temp 98.4 F (36.9 C) (Oral)   Resp 16   SpO2 98%   Physical Exam HENT:     Head: Normocephalic and atraumatic.     Right Ear: Tympanic membrane, ear canal and external ear normal.     Left Ear: Tympanic membrane, ear canal and external ear normal.     Nose: Nose normal.     Mouth/Throat:     Mouth: Mucous membranes are moist.  Pharynx: Oropharynx is clear.  Eyes:     Extraocular Movements: Extraocular movements intact.     Conjunctiva/sclera: Conjunctivae normal.     Pupils: Pupils are equal, round, and reactive to light.  Cardiovascular:     Rate and Rhythm: Normal rate and regular rhythm.     Pulses: Normal pulses.     Heart sounds: Normal heart sounds.  Pulmonary:     Effort: Pulmonary effort is normal.     Breath sounds: Normal breath sounds.  Musculoskeletal:     Cervical back: Normal range of motion and neck supple.  Neurological:     General: No focal deficit present.     Mental Status: She is alert and oriented to person, place, and time.  Psychiatric:        Mood and Affect: Mood normal.         Behavior: Behavior normal.    Results for orders placed or performed in visit on 09/25/21  POCT glycosylated hemoglobin (Hb A1C)  Result Value Ref Range   Hemoglobin A1C 5.2 4.0 - 5.6 %   HbA1c POC (<> result, manual entry)     HbA1c, POC (prediabetic range)     HbA1c, POC (controlled diabetic range)      ASSESSMENT AND PLAN: 1. Frequent urination 2. Increased thirst 3. Diabetes mellitus screening - No diabetes. - POCT glycosylated hemoglobin (Hb A1C)  4. IUD (intrauterine device) in place - Patient reports she is established with Gynecology. Plans to schedule with them soon and discuss frequent urination/urogynecology concerns.   5. Nausea without vomiting - Ondansetron as prescribed. Counseled on medication adherence and adverse effects.  - Follow-up with primary provider as scheduled. - ondansetron (ZOFRAN-ODT) 4 MG disintegrating tablet; Take 1 tablet (4 mg total) by mouth every 8 (eight) hours as needed for nausea or vomiting.  Dispense: 20 tablet; Refill: 1  6. Thyroid disorder screen - TSH to check thyroid function.  - TSH  7. Screening for metabolic disorder - DYN18+ZFPO to check kidney function, liver function, and electrolyte balance.  - CMP14+EGFR  8. Screening for deficiency anemia - CBC to screen for anemia. - CBC  9. History of systemic lupus erythematosus ([place]) - Referral to Rheumatology for further evaluation and management.  - Ambulatory referral to Rheumatology   Patient was given the opportunity to ask questions.  Patient verbalized understanding of the plan and was able to repeat key elements of the plan. Patient was given clear instructions to go to Emergency Department or return to medical center if symptoms don't improve, worsen, or new problems develop.The patient verbalized understanding.   Orders Placed This Encounter  Procedures   CMP14+EGFR   TSH   CBC   Ambulatory referral to Rheumatology   POCT glycosylated hemoglobin (Hb A1C)      Requested Prescriptions   Signed Prescriptions Disp Refills   ondansetron (ZOFRAN-ODT) 4 MG disintegrating tablet 20 tablet 1    Sig: Take 1 tablet (4 mg total) by mouth every 8 (eight) hours as needed for nausea or vomiting.    Follow-up with primary provider as scheduled.   Camillia Herter, NP

## 2021-09-25 ENCOUNTER — Ambulatory Visit (INDEPENDENT_AMBULATORY_CARE_PROVIDER_SITE_OTHER): Payer: No Typology Code available for payment source | Admitting: Family

## 2021-09-25 VITALS — BP 116/79 | HR 90 | Temp 98.4°F | Resp 16

## 2021-09-25 DIAGNOSIS — R631 Polydipsia: Secondary | ICD-10-CM

## 2021-09-25 DIAGNOSIS — Z1329 Encounter for screening for other suspected endocrine disorder: Secondary | ICD-10-CM

## 2021-09-25 DIAGNOSIS — Z130 Encounter for screening for diseases of the blood and blood-forming organs and certain disorders involving the immune mechanism: Secondary | ICD-10-CM

## 2021-09-25 DIAGNOSIS — R350 Frequency of micturition: Secondary | ICD-10-CM | POA: Diagnosis not present

## 2021-09-25 DIAGNOSIS — Z13228 Encounter for screening for other metabolic disorders: Secondary | ICD-10-CM

## 2021-09-25 DIAGNOSIS — Z131 Encounter for screening for diabetes mellitus: Secondary | ICD-10-CM

## 2021-09-25 DIAGNOSIS — M329 Systemic lupus erythematosus, unspecified: Secondary | ICD-10-CM

## 2021-09-25 DIAGNOSIS — R110 Nausea: Secondary | ICD-10-CM | POA: Diagnosis not present

## 2021-09-25 DIAGNOSIS — Z975 Presence of (intrauterine) contraceptive device: Secondary | ICD-10-CM

## 2021-09-25 LAB — POCT GLYCOSYLATED HEMOGLOBIN (HGB A1C): Hemoglobin A1C: 5.2 % (ref 4.0–5.6)

## 2021-09-25 MED ORDER — ONDANSETRON 4 MG PO TBDP: 4.0000 mg | ORAL_TABLET | Freq: Three times a day (TID) | ORAL | 1 refills | Status: DC | PRN

## 2021-09-25 NOTE — Progress Notes (Signed)
Patient c/o nausea, thirsty, frequency in urination, and feels like head is under water, patient she feels bad all  day long. Patient would like to talk w/ pcp about her symptoms

## 2021-09-26 ENCOUNTER — Other Ambulatory Visit: Payer: Self-pay | Admitting: Family

## 2021-09-26 DIAGNOSIS — D638 Anemia in other chronic diseases classified elsewhere: Secondary | ICD-10-CM

## 2021-09-26 DIAGNOSIS — R748 Abnormal levels of other serum enzymes: Secondary | ICD-10-CM

## 2021-09-26 LAB — CMP14+EGFR
ALT: 39 IU/L — ABNORMAL HIGH (ref 0–32)
AST: 53 IU/L — ABNORMAL HIGH (ref 0–40)
Albumin/Globulin Ratio: 0.7 — ABNORMAL LOW (ref 1.2–2.2)
Albumin: 3.8 g/dL (ref 3.8–4.8)
Alkaline Phosphatase: 120 IU/L (ref 44–121)
BUN/Creatinine Ratio: 8 — ABNORMAL LOW (ref 9–23)
BUN: 8 mg/dL (ref 6–24)
Bilirubin Total: 0.4 mg/dL (ref 0.0–1.2)
CO2: 22 mmol/L (ref 20–29)
Calcium: 8.8 mg/dL (ref 8.7–10.2)
Chloride: 108 mmol/L — ABNORMAL HIGH (ref 96–106)
Creatinine, Ser: 1.04 mg/dL — ABNORMAL HIGH (ref 0.57–1.00)
Globulin, Total: 5.7 g/dL — ABNORMAL HIGH (ref 1.5–4.5)
Glucose: 90 mg/dL (ref 70–99)
Potassium: 4 mmol/L (ref 3.5–5.2)
Sodium: 142 mmol/L (ref 134–144)
Total Protein: 9.5 g/dL — ABNORMAL HIGH (ref 6.0–8.5)
eGFR: 68 mL/min/1.73

## 2021-09-26 LAB — CBC
Hematocrit: 32.2 % — ABNORMAL LOW (ref 34.0–46.6)
Hemoglobin: 10 g/dL — ABNORMAL LOW (ref 11.1–15.9)
MCH: 24.3 pg — ABNORMAL LOW (ref 26.6–33.0)
MCHC: 31.1 g/dL — ABNORMAL LOW (ref 31.5–35.7)
MCV: 78 fL — ABNORMAL LOW (ref 79–97)
Platelets: 249 10*3/uL (ref 150–450)
RBC: 4.11 x10E6/uL (ref 3.77–5.28)
RDW: 14.2 % (ref 11.7–15.4)
WBC: 4 10*3/uL (ref 3.4–10.8)

## 2021-09-26 LAB — TSH: TSH: 3.49 u[IU]/mL (ref 0.450–4.500)

## 2021-09-28 ENCOUNTER — Telehealth: Payer: Self-pay | Admitting: Hematology and Oncology

## 2021-09-28 NOTE — Telephone Encounter (Signed)
Scheduled appt per 7/8 referral. Pt is aware of appt date and time. Pt is aware to arrive 15 mins prior to appt time and to bring and updated insurance card. Pt is aware of appt location.   

## 2021-10-05 ENCOUNTER — Encounter: Payer: Self-pay | Admitting: Nurse Practitioner

## 2021-10-13 ENCOUNTER — Telehealth: Payer: Self-pay | Admitting: Hematology and Oncology

## 2021-10-13 NOTE — Telephone Encounter (Signed)
R/s pt's new hem appt. Pt is aware of new appt date/time.  

## 2021-10-19 ENCOUNTER — Encounter: Payer: No Typology Code available for payment source | Admitting: Hematology and Oncology

## 2021-10-19 ENCOUNTER — Other Ambulatory Visit: Payer: No Typology Code available for payment source

## 2021-10-22 ENCOUNTER — Encounter: Payer: Self-pay | Admitting: Hematology and Oncology

## 2021-10-22 ENCOUNTER — Inpatient Hospital Stay: Payer: No Typology Code available for payment source

## 2021-10-22 ENCOUNTER — Inpatient Hospital Stay
Payer: No Typology Code available for payment source | Attending: Hematology and Oncology | Admitting: Hematology and Oncology

## 2021-10-22 ENCOUNTER — Other Ambulatory Visit: Payer: Self-pay

## 2021-10-22 VITALS — BP 128/87 | HR 74 | Temp 97.7°F | Resp 18 | Ht 65.0 in | Wt 215.5 lb

## 2021-10-22 DIAGNOSIS — D509 Iron deficiency anemia, unspecified: Secondary | ICD-10-CM | POA: Diagnosis not present

## 2021-10-22 DIAGNOSIS — Z803 Family history of malignant neoplasm of breast: Secondary | ICD-10-CM | POA: Diagnosis not present

## 2021-10-22 LAB — HEMOGLOBIN A1C
Hgb A1c MFr Bld: 5 % (ref 4.8–5.6)
Mean Plasma Glucose: 96.8 mg/dL

## 2021-10-22 LAB — CBC WITH DIFFERENTIAL/PLATELET
Abs Immature Granulocytes: 0.01 10*3/uL (ref 0.00–0.07)
Basophils Absolute: 0 10*3/uL (ref 0.0–0.1)
Basophils Relative: 1 %
Eosinophils Absolute: 0.1 10*3/uL (ref 0.0–0.5)
Eosinophils Relative: 2 %
HCT: 30.6 % — ABNORMAL LOW (ref 36.0–46.0)
Hemoglobin: 9.7 g/dL — ABNORMAL LOW (ref 12.0–15.0)
Immature Granulocytes: 0 %
Lymphocytes Relative: 44 %
Lymphs Abs: 2 10*3/uL (ref 0.7–4.0)
MCH: 24.8 pg — ABNORMAL LOW (ref 26.0–34.0)
MCHC: 31.7 g/dL (ref 30.0–36.0)
MCV: 78.3 fL — ABNORMAL LOW (ref 80.0–100.0)
Monocytes Absolute: 0.9 10*3/uL (ref 0.1–1.0)
Monocytes Relative: 21 %
Neutro Abs: 1.4 10*3/uL — ABNORMAL LOW (ref 1.7–7.7)
Neutrophils Relative %: 32 %
Platelets: 260 10*3/uL (ref 150–400)
RBC: 3.91 MIL/uL (ref 3.87–5.11)
RDW: 14.2 % (ref 11.5–15.5)
WBC: 4.3 10*3/uL (ref 4.0–10.5)
nRBC: 0 % (ref 0.0–0.2)

## 2021-10-22 LAB — IRON AND IRON BINDING CAPACITY (CC-WL,HP ONLY)
Iron: 98 ug/dL (ref 28–170)
Saturation Ratios: 32 % — ABNORMAL HIGH (ref 10.4–31.8)
TIBC: 305 ug/dL (ref 250–450)
UIBC: 207 ug/dL (ref 148–442)

## 2021-10-22 NOTE — Progress Notes (Signed)
[place] NOTE  Patient Care Team: Camillia Herter, NP as PCP - General (Nurse Practitioner)  CHIEF COMPLAINTS/PURPOSE OF CONSULTATION:  Microcytic hypochromic anemia.  ASSESSMENT & PLAN:   This is a very pleasant 41 year old female patient with microcytic hypochromic anemia with new onset fatigue, dizziness, shortness of breath, change in bowel habits reported hematology for evaluation.  Patient arrived to the appointment today with her husband.  She tells me that many years ago, she was darker mucus but never required any treatment, never had to see any rheumatologist.  Physical examination today quite unremarkable, no palpable lymphadenopathy or reviewed her labs over the last several years.  She has had longstanding microcytic hypochromic anemia with no significant change in hemoglobin. At this time, I do not believe her symptoms are related to this mild anemia since this is longstanding.  I wonder if she has any form of thalassemia.  We have discussed repeating a CBC, iron panel, ferritin, and serum protein electrophoresis.  She does have elevated total protein but this is likely because of increased globulin.  If the above-mentioned labs are unremarkable,She can return to clinic in about 3 months, we can continue to monitor her hemoglobin and if this remains stable, we can discharge her back to her PCP with as needed consultation.  HISTORY OF PRESENTING ILLNESS:  Connie Foster 41 y.o. female is here because of anemia  This is a very pleasant 41 year old premenopausal female patient currently on Mirena IUD referred to hematology for evaluation ultrasound new onset fatigue, nausea, dizziness as well as given her microcytic hypochromic anemia.  Patient mentions to me that her symptoms started about 3 to 4 months ago and she has these random episodes of dizziness, feels very tired does not feel the urge to do any activities.  She has intermittent nausea followed by  bilious vomiting, new onset constipation.  Many years ago, she was diagnosed with lupus, however she did not require any medication.  She mentions that the lupus was diagnosed on laboratory work-up.  No change in menstruation, she had this Mirena for almost 6 years.  She denies any other over-the-counter medication.  No recent infections or hospitalizations.  She has 1 child, did not have any anemia related complications during pregnancy. Rest of the pertinent 10 point ROS reviewed and negative  REVIEW OF SYSTEMS:   Constitutional: Denies fevers, chills or abnormal night sweats Eyes: Denies blurriness of vision, double vision or watery eyes Ears, nose, mouth, throat, and face: Denies mucositis or sore throat Respiratory: Denies cough, dyspnea or wheezes Cardiovascular: Denies palpitation, chest discomfort or lower extremity swelling Gastrointestinal:  Denies nausea, heartburn or change in bowel habits Skin: Denies abnormal skin rashes Lymphatics: Denies new lymphadenopathy or easy bruising Neurological:Denies numbness, tingling or new weaknesses Behavioral/Psych: Mood is stable, no new changes  All other systems were reviewed with the patient and are negative.  MEDICAL HISTORY:  Past Medical History:  Diagnosis Date   History of hypertension     SURGICAL HISTORY: Past Surgical History:  Procedure Laterality Date   CESAREAN SECTION     SLEEVE GASTROPLASTY     TONSILLECTOMY      SOCIAL HISTORY: Social History   Socioeconomic History   Marital status: Married    Spouse name: Not on file   Number of children: 1   Years of education: college   Highest education level: Bachelor's degree (e.g., BA, AB, BS)  Occupational History   Not on file  Tobacco Use  Smoking status: Never   Smokeless tobacco: Never  Vaping Use   Vaping Use: Never used  Substance and Sexual Activity   Alcohol use: Yes    Comment: on special occasions   Drug use: No   Sexual activity: Yes    Birth  control/protection: Pill  Other Topics Concern   Not on file  Social History Narrative   Not on file   Social Determinants of Health   Financial Resource Strain: Not on file  Food Insecurity: Not on file  Transportation Needs: Not on file  Physical Activity: Not on file  Stress: Not on file  Social Connections: Not on file  Intimate Partner Violence: Not on file    FAMILY HISTORY: Family History  Problem Relation Age of Onset   Hypertension Mother    Allergies Mother    Diabetes Mother    Breast cancer Maternal Grandmother    Thyroid disease Brother     ALLERGIES:  is allergic to lisinopril.  MEDICATIONS:  Current Outpatient Medications  Medication Sig Dispense Refill   levonorgestrel (MIRENA, 52 MG,) 20 MCG/24HR IUD Mirena 20 mcg/24 hours (6 yrs) 52 mg intrauterine device  Take 1 device by intrauterine route.     ondansetron (ZOFRAN-ODT) 4 MG disintegrating tablet Take 1 tablet (4 mg total) by mouth every 8 (eight) hours as needed for nausea or vomiting. 20 tablet 1   No current facility-administered medications for this visit.     PHYSICAL EXAMINATION: ECOG PERFORMANCE STATUS: 0 - Asymptomatic  Vitals:   10/22/21 1407  BP: 128/87  Pulse: 74  Resp: 18  Temp: 97.7 F (36.5 C)  SpO2: 99%   Filed Weights   10/22/21 1407  Weight: 215 lb 8 oz (97.8 kg)   Physical Exam Constitutional:      Appearance: Normal appearance.  Cardiovascular:     Rate and Rhythm: Normal rate and regular rhythm.     Pulses: Normal pulses.     Heart sounds: Normal heart sounds.  Pulmonary:     Effort: Pulmonary effort is normal.     Breath sounds: Normal breath sounds.  Abdominal:     General: Abdomen is flat. There is no distension.     Palpations: Abdomen is soft. There is no mass.  Musculoskeletal:        General: No swelling or tenderness.     Cervical back: Normal range of motion and neck supple. No rigidity.  Lymphadenopathy:     Cervical: No cervical adenopathy.   Skin:    General: Skin is warm and dry.  Neurological:     Mental Status: She is alert.      LABORATORY DATA:  I have reviewed the data as listed Lab Results  Component Value Date   WBC 4.0 09/25/2021   HGB 10.0 (L) 09/25/2021   HCT 32.2 (L) 09/25/2021   MCV 78 (L) 09/25/2021   PLT 249 09/25/2021     Chemistry      Component Value Date/Time   NA 142 09/25/2021 1603   K 4.0 09/25/2021 1603   CL 108 (H) 09/25/2021 1603   CO2 22 09/25/2021 1603   BUN 8 09/25/2021 1603   CREATININE 1.04 (H) 09/25/2021 1603      Component Value Date/Time   CALCIUM 8.8 09/25/2021 1603   ALKPHOS 120 09/25/2021 1603   AST 53 (H) 09/25/2021 1603   ALT 39 (H) 09/25/2021 1603   BILITOT 0.4 09/25/2021 1603       RADIOGRAPHIC STUDIES: I have personally  reviewed the radiological images as listed and agreed with the findings in the report. No results found.  All questions were answered. The patient knows to call the clinic with any problems, questions or concerns. I spent 45 minutes in the care of this patient including H and P, review of records, counseling and coordination of care.     Rachel Moulds, MD 10/22/2021 2:18 PM

## 2021-10-24 LAB — HGB FRACTIONATION CASCADE
Hgb A2: 5.7 % — ABNORMAL HIGH (ref 1.8–3.2)
Hgb A: 92.3 % — ABNORMAL LOW (ref 96.4–98.8)
Hgb F: 2 % (ref 0.0–2.0)
Hgb S: 0 %

## 2021-10-26 LAB — PROTEIN ELECTROPHORESIS, SERUM, WITH REFLEX
A/G Ratio: 0.6 — ABNORMAL LOW (ref 0.7–1.7)
Albumin ELP: 3.5 g/dL (ref 2.9–4.4)
Alpha-1-Globulin: 0.2 g/dL (ref 0.0–0.4)
Alpha-2-Globulin: 0.6 g/dL (ref 0.4–1.0)
Beta Globulin: 1 g/dL (ref 0.7–1.3)
Gamma Globulin: 3.8 g/dL — ABNORMAL HIGH (ref 0.4–1.8)
Globulin, Total: 5.6 g/dL — ABNORMAL HIGH (ref 2.2–3.9)
Total Protein ELP: 9.1 g/dL — ABNORMAL HIGH (ref 6.0–8.5)

## 2021-10-29 ENCOUNTER — Inpatient Hospital Stay (HOSPITAL_BASED_OUTPATIENT_CLINIC_OR_DEPARTMENT_OTHER): Payer: No Typology Code available for payment source | Admitting: Hematology and Oncology

## 2021-10-29 ENCOUNTER — Encounter: Payer: Self-pay | Admitting: Hematology and Oncology

## 2021-10-29 DIAGNOSIS — D509 Iron deficiency anemia, unspecified: Secondary | ICD-10-CM

## 2021-10-29 NOTE — Progress Notes (Signed)
[name] CONSULT NOTE  Patient Care Team: Rema Fendt, NP as PCP - General (Nurse Practitioner)  CHIEF COMPLAINTS/PURPOSE OF CONSULTATION:  Microcytic hypochromic anemia.  ASSESSMENT & PLAN:   This is a very pleasant 41 year old female patient with microcytic hypochromic anemia with new onset fatigue, dizziness, shortness of breath, change in bowel habits reported hematology for evaluation She is here for telephone visit to review her lab results.  Her labs show microcytic hypochromic anemia with a hemoglobin of 9.8.  She has had chronic anemia.  Her prior ferritin back in January was 33.  She is not on any oral iron supplementation.  No change since her last visit with [place].  Physical examination not done, telephone visit. I have reviewed her labs, no evidence of M protein, ferritin not done, rest of the iron panel with no clear evidence of iron deficiency.  Her hemoglobin electrophoresis showed thalassemia minor.  We have discussed about beta thalassemia minor today.  Since she is done with childbearing, I recommended that her kids when of childbearing age should also be tested since they can have offsprings with thalassemia major if their partner also happens to have beta thalassemia trait.  She expressed understanding.    She will come back for a ferritin drop next week.  If she does appear to have mild iron deficiency, I plan to try oral iron supplementation once a day for about 3 months.   HISTORY OF PRESENTING ILLNESS:  Connie Foster 42 y.o. female is here because of anemia  This is a very pleasant 41 year old premenopausal female patient currently on Mirena IUD referred to hematology for evaluation ultrasound new onset fatigue, nausea, dizziness as well as given her microcytic hypochromic anemia.    She is here for telephone visit, no new changes since her last visit.  She continues to report chronic fatigue and constipation.  She has a follow-up visit with GI  next week.  REVIEW OF SYSTEMS:   Constitutional: Denies fevers, chills or abnormal night sweats Eyes: Denies blurriness of vision, double vision or watery eyes Ears, nose, mouth, throat, and face: Denies mucositis or sore throat Respiratory: Denies cough, dyspnea or wheezes Cardiovascular: Denies palpitation, chest discomfort or lower extremity swelling Gastrointestinal:  Denies nausea, heartburn or change in bowel habits Skin: Denies abnormal skin rashes Lymphatics: Denies new lymphadenopathy or easy bruising Neurological:Denies numbness, tingling or new weaknesses Behavioral/Psych: Mood is stable, no new changes  All other systems were reviewed with the patient and are negative.  MEDICAL HISTORY:  Past Medical History:  Diagnosis Date   History of hypertension     SURGICAL HISTORY: Past Surgical History:  Procedure Laterality Date   CESAREAN SECTION     SLEEVE GASTROPLASTY     TONSILLECTOMY      SOCIAL HISTORY: Social History   Socioeconomic History   Marital status: Married    Spouse name: Not on file   Number of children: 1   Years of education: college   Highest education level: Bachelor's degree (e.g., BA, AB, BS)  Occupational History   Not on file  Tobacco Use   Smoking status: Never   Smokeless tobacco: Never  Vaping Use   Vaping Use: Never used  Substance and Sexual Activity   Alcohol use: Yes    Comment: on special occasions   Drug use: No   Sexual activity: Yes    Birth control/protection: Pill  Other Topics Concern   Not on file  Social History Narrative   Not  on file   Social Determinants of Health   Financial Resource Strain: Not on file  Food Insecurity: Not on file  Transportation Needs: Not on file  Physical Activity: Not on file  Stress: Not on file  Social Connections: Not on file  Intimate Partner Violence: Not on file    FAMILY HISTORY: Family History  Problem Relation Age of Onset   Hypertension Mother    Allergies Mother     Diabetes Mother    Breast cancer Maternal Grandmother    Thyroid disease Brother     ALLERGIES:  is allergic to lisinopril.  MEDICATIONS:  Current Outpatient Medications  Medication Sig Dispense Refill   levonorgestrel (MIRENA, 52 MG,) 20 MCG/24HR IUD Mirena 20 mcg/24 hours (6 yrs) 52 mg intrauterine device  Take 1 device by intrauterine route.     ondansetron (ZOFRAN-ODT) 4 MG disintegrating tablet Take 1 tablet (4 mg total) by mouth every 8 (eight) hours as needed for nausea or vomiting. 20 tablet 1   No current facility-administered medications for this visit.     PHYSICAL EXAMINATION: ECOG PERFORMANCE STATUS: 0 - Asymptomatic  There were no vitals filed for this visit.  There were no vitals filed for this visit.  Physical examination vitals not done, telephone visit   LABORATORY DATA:  I have reviewed the data as listed Lab Results  Component Value Date   WBC 4.3 10/22/2021   HGB 9.7 (L) 10/22/2021   HCT 30.6 (L) 10/22/2021   MCV 78.3 (L) 10/22/2021   PLT 260 10/22/2021     Chemistry      Component Value Date/Time   NA 142 09/25/2021 1603   K 4.0 09/25/2021 1603   CL 108 (H) 09/25/2021 1603   CO2 22 09/25/2021 1603   BUN 8 09/25/2021 1603   CREATININE 1.04 (H) 09/25/2021 1603      Component Value Date/Time   CALCIUM 8.8 09/25/2021 1603   ALKPHOS 120 09/25/2021 1603   AST 53 (H) 09/25/2021 1603   ALT 39 (H) 09/25/2021 1603   BILITOT 0.4 09/25/2021 1603      We have reviewed labs from last visit today in detail. RADIOGRAPHIC STUDIES: I have personally reviewed the radiological images as listed and agreed with the findings in the report. No results found.  All questions were answered. The patient knows to call the clinic with any problems, questions or concerns.  I connected with  Candra Deskin on 10/29/21 by a telephone application and verified that I am speaking with the correct person using two identifiers.   I discussed the limitations of  evaluation and management by telemedicine. The patient expressed understanding and agreed to proceed.   I spent 15 minutes in the care of this patient including H and P, review of records, counseling and coordination of care.     Rachel Moulds, MD 10/29/2021 12:52 PM

## 2021-10-30 ENCOUNTER — Telehealth: Payer: Self-pay | Admitting: Hematology and Oncology

## 2021-10-30 NOTE — Telephone Encounter (Signed)
Contacted patient to scheduled appointments. Left message with appointment details and a call back number if patient had any questions or could not accommodate the time we provided.   

## 2021-11-04 ENCOUNTER — Inpatient Hospital Stay: Payer: No Typology Code available for payment source

## 2021-11-04 ENCOUNTER — Other Ambulatory Visit (INDEPENDENT_AMBULATORY_CARE_PROVIDER_SITE_OTHER): Payer: No Typology Code available for payment source

## 2021-11-04 ENCOUNTER — Encounter: Payer: Self-pay | Admitting: Nurse Practitioner

## 2021-11-04 ENCOUNTER — Other Ambulatory Visit: Payer: Self-pay

## 2021-11-04 ENCOUNTER — Ambulatory Visit (INDEPENDENT_AMBULATORY_CARE_PROVIDER_SITE_OTHER): Payer: No Typology Code available for payment source | Admitting: Nurse Practitioner

## 2021-11-04 VITALS — BP 134/84 | HR 75 | Ht 65.0 in | Wt 213.0 lb

## 2021-11-04 DIAGNOSIS — D509 Iron deficiency anemia, unspecified: Secondary | ICD-10-CM

## 2021-11-04 DIAGNOSIS — R748 Abnormal levels of other serum enzymes: Secondary | ICD-10-CM

## 2021-11-04 DIAGNOSIS — K5900 Constipation, unspecified: Secondary | ICD-10-CM

## 2021-11-04 DIAGNOSIS — K921 Melena: Secondary | ICD-10-CM | POA: Diagnosis not present

## 2021-11-04 LAB — CBC WITH DIFFERENTIAL/PLATELET
Abs Immature Granulocytes: 0.01 10*3/uL (ref 0.00–0.07)
Basophils Absolute: 0 10*3/uL (ref 0.0–0.1)
Basophils Relative: 1 %
Eosinophils Absolute: 0.1 10*3/uL (ref 0.0–0.5)
Eosinophils Relative: 2 %
HCT: 30.3 % — ABNORMAL LOW (ref 36.0–46.0)
Hemoglobin: 9.6 g/dL — ABNORMAL LOW (ref 12.0–15.0)
Immature Granulocytes: 0 %
Lymphocytes Relative: 49 %
Lymphs Abs: 1.8 10*3/uL (ref 0.7–4.0)
MCH: 24.6 pg — ABNORMAL LOW (ref 26.0–34.0)
MCHC: 31.7 g/dL (ref 30.0–36.0)
MCV: 77.5 fL — ABNORMAL LOW (ref 80.0–100.0)
Monocytes Absolute: 0.8 10*3/uL (ref 0.1–1.0)
Monocytes Relative: 21 %
Neutro Abs: 1 10*3/uL — ABNORMAL LOW (ref 1.7–7.7)
Neutrophils Relative %: 27 %
Platelets: 257 10*3/uL (ref 150–400)
RBC: 3.91 MIL/uL (ref 3.87–5.11)
RDW: 14.4 % (ref 11.5–15.5)
Smear Review: NORMAL
WBC: 3.8 10*3/uL — ABNORMAL LOW (ref 4.0–10.5)
nRBC: 0 % (ref 0.0–0.2)

## 2021-11-04 LAB — CBC
HCT: 31.8 % — ABNORMAL LOW (ref 36.0–46.0)
Hemoglobin: 9.9 g/dL — ABNORMAL LOW (ref 12.0–15.0)
MCHC: 31.2 g/dL (ref 30.0–36.0)
MCV: 78.1 fl (ref 78.0–100.0)
Platelets: 255 10*3/uL (ref 150.0–400.0)
RBC: 4.07 Mil/uL (ref 3.87–5.11)
RDW: 14.6 % (ref 11.5–15.5)
WBC: 3.6 10*3/uL — ABNORMAL LOW (ref 4.0–10.5)

## 2021-11-04 LAB — FERRITIN
Ferritin: 104.3 ng/mL (ref 10.0–291.0)
Ferritin: 94 ng/mL (ref 11–307)

## 2021-11-04 LAB — HEPATITIS B SURFACE ANTIBODY,QUALITATIVE: Hep B S Ab: REACTIVE — AB

## 2021-11-04 LAB — HEPATITIS B SURFACE ANTIGEN: Hepatitis B Surface Ag: NONREACTIVE

## 2021-11-04 LAB — HEPATIC FUNCTION PANEL
ALT: 135 U/L — ABNORMAL HIGH (ref 0–35)
AST: 117 U/L — ABNORMAL HIGH (ref 0–37)
Albumin: 4 g/dL (ref 3.5–5.2)
Alkaline Phosphatase: 86 U/L (ref 39–117)
Bilirubin, Direct: 0 mg/dL (ref 0.0–0.3)
Total Bilirubin: 0.8 mg/dL (ref 0.2–1.2)
Total Protein: 10 g/dL — ABNORMAL HIGH (ref 6.0–8.3)

## 2021-11-04 LAB — HEPATITIS A ANTIBODY, TOTAL: Hepatitis A AB,Total: REACTIVE — AB

## 2021-11-04 MED ORDER — NA SULFATE-K SULFATE-MG SULF 17.5-3.13-1.6 GM/177ML PO SOLN: 1.0000 | Freq: Once | ORAL | 0 refills | Status: AC

## 2021-11-04 NOTE — Patient Instructions (Addendum)
Start 1 capful of Miralax in 8 ounces of water daily.  Your provider has requested that you go to the basement level for lab work before leaving today. Press "B" on the elevator. The lab is located at the first door on the left as you exit the elevator.  You will be contacted by [facility] Scheduling in the next 7 days to arrange a Ultrasound.  The number on your caller ID will be [phone], please answer when they call.  If you have not heard from them in 7 days please call [phone] to schedule.    You have been scheduled for an endoscopy and colonoscopy. Please follow the written instructions given to you at your visit today. Please pick up your prep supplies at the pharmacy within the next 1-3 days. If you use inhalers (even only as needed), please bring them with you on the day of your procedure.  The [facility] providers would like to encourage you to use [facility] to communicate with providers for non-urgent requests or questions.  Due to long hold times on the telephone, sending your provider a message by [facility] may be a faster and more efficient way to get a response.  Please allow 48 business hours for a response.  Please remember that this is for non-urgent requests.   Due to recent changes in healthcare laws, you may see the results of your imaging and laboratory studies on MyChart before your provider has had a chance to review them.  We understand that in some cases there may be results that are confusing or concerning to you. Not all laboratory results come back in the same time frame and the provider may be waiting for multiple results in order to interpret others.  Please give [place] 48 hours in order for your provider to thoroughly review all the results before contacting the office for clarification of your results.

## 2021-11-04 NOTE — Progress Notes (Signed)
Chief Complaint:  Referred for abnormal liver tests but having constipation    Assessment & Plan   #41 yo female with a history of sleeve gastroplasty in 2015 referred for new mild elevation in liver enzymes.  Minimal AST elevation of 43 in January 2023. In early July AST 53 / ALT 39, normal alk phos and bilirubin. Etiology unclear but possibly fatty liver disease. Medications unlikely contributing factor. AST / ALT ratio not suspicious for ETOH and she rarely drinks.  Will repeat liver chemistries today to see if remain elevated. If they are then she will obtain a complete hepatic serologic workukp minus HCV antibody and iron studies which have recently been done.   She does have an elevated total protein of 9.5 with normal albumin 4 giving her a low A/G ratio of 0.6667.Will see what today's labs show. I see she had a protein electrophoresis done earlier this month.   # Bowel changes with new constipation defined as decreased frequency of BMs over the last month.  TSH normal. She drinks a large volume of water everyday  Taking Fiber Gummies for last two weeks without improvement.  Trial of Miralax daily Schedule for a colonoscopy to evaluate bowel changes. The risks and benefits of colonoscopy with possible polypectomy / biopsies were discussed and the patient agrees to proceed.   # Intermittent black stool over the last few weeks in absence of oral iron / bismuth. Brown,  heme negative stool on exam today. Cannot be sure that she isn't having intermittent upper GI tract bleeding but other than some leafy greens I don't know what else who be causing this.  Will schedule for EGD to be done at same time as colonoscopy. The risks and benefits of EGD with possible biopsies were discussed with the patient who agrees to proceed.   # Chronic microcytic anemia ( dates back years). Though she has had a gastric sleeve I don't think that would lead to iron deficiency and her labs don't suggest iron  deficiency except that her ferritin is only in the 20s-30's range. She hasn't had a menstrual cycle in many years.    HPI:    Connie Foster is a 41 y.o. year old female with PMH of HTN and a sleeve gastroplasty,. See PMH for additional history    Patient is new to the practice, referred by PCP for evaluation of elevated liver enzymes.    In Jan 2023 her AST was minimally elevated at 43. In early July AST 53, ALT 39. Alk phos and bilirubin have been normal. Total protein has been elevated 8.8 >> 9.5. She takes Tylenol less than twice a month. She has taken "Pre-Workout" about three times a weeks for years. She started a multivitamin but only in the last couple of weeks. She also start fiber gummies a couple of weeks ago. No other OTC meds. No Rx meds. She doesn't consume Etoh. She has no [place] of liver disease. She has several tattoos placed professionally.   HCV Ab negative in January 2023.  Connie Foster has been struggling with constipation for several weeks. She is having infrequent BMs. She drinks a lot of water everyday, maybe 2 liters. She cannot attribute constipation to any diet changes. No blood in stool but stools intermittently black over last few weeks. She doesn't take iron or bismuth. Takes a MV but just started that a couple of weeks ago. She has no upper abdominal pain, nausea / vomiting. She takes Motrin about twice a month.  Connie Foster has an uncle who was diagnosed with colon cancer in his 13's.  Patient drinks a lot of water every day but feels like she is still always thirsty.  She complains of excessive urination.  I see she mention these things to her PCP in July.  Serum glucose was normal as well as her hemoglobin A1c   Previous Labs / Imaging::    Latest Ref Rng & Units 10/22/2021    2:47 PM 09/25/2021    4:03 PM 04/17/2021    3:36 PM  CBC  WBC 4.0 - 10.5 K/uL 4.3  4.0  4.3   Hemoglobin 12.0 - 15.0 g/dL 9.7  10.0  10.7   Hematocrit 36.0 - 46.0 % 30.6  32.2  33.8    Platelets 150 - 400 K/uL 260  249  278     No results found for: "LIPASE"    Latest Ref Rng & Units 09/25/2021    4:03 PM 04/17/2021    3:36 PM 03/13/2015    3:17 AM  CMP  Glucose 70 - 99 mg/dL 90  81  149   BUN 6 - 24 mg/dL '8  10  6   ' Creatinine 0.57 - 1.00 mg/dL 1.04  1.00  0.91   Sodium 134 - 144 mmol/L 142  139  137   Potassium 3.5 - 5.2 mmol/L 4.0  4.2  3.8   Chloride 96 - 106 mmol/L 108  107  106   CO2 20 - 29 mmol/L '22  21  22   ' Calcium 8.7 - 10.2 mg/dL 8.8  8.7  8.9   Total Protein 6.0 - 8.5 g/dL 9.5  8.8  7.8   Total Bilirubin 0.0 - 1.2 mg/dL 0.4  0.5  0.6   Alkaline Phos 44 - 121 IU/L 120  121  60   AST 0 - 40 IU/L 53  43  22   ALT 0 - 32 IU/L 39  28  10    Component     Latest Ref Rng 04/17/2021  Abs Immature Granulocytes     0.00 - 0.07 K/uL   TIBC     250 - 450 ug/dL 330   UIBC     148 - 442 ug/dL 259   Iron     28 - 170 ug/dL 71   Iron Saturation     15 - 55 % 22   Ferritin     15 - 150 ng/mL 33   Saturation Ratios     10.4 - 31.8 %      Past Medical History:  Diagnosis Date   History of hypertension    Hypokalemia    Systemic lupus erythematosus (HCC)    Past Surgical History:  Procedure Laterality Date   CESAREAN SECTION     SLEEVE GASTROPLASTY     TONSILLECTOMY     Family History  Problem Relation Age of Onset   Hypertension Mother    Allergies Mother    Diabetes Mother    Breast cancer Maternal Grandmother    Thyroid disease Brother    Social History   Tobacco Use   Smoking status: Never   Smokeless tobacco: Never  Vaping Use   Vaping Use: Never used  Substance Use Topics   Alcohol use: Yes    Comment: on special occasions   Drug use: No   Current Outpatient Medications  Medication Sig Dispense Refill   levonorgestrel (MIRENA, 52 MG,) 20 MCG/24HR IUD Mirena 20 mcg/24 hours (6 yrs) 52 mg  intrauterine device  Take 1 device by intrauterine route.     No current facility-administered medications for this visit.   Allergies   Allergen Reactions   Lisinopril Anaphylaxis and Swelling     Review of Systems: Positive for fatigue, excessive thirst and excessive urination.  All other systems reviewed and negative except where noted in HPI.   Wt Readings from Last 3 Encounters:  11/04/21 213 lb (96.6 kg)  10/22/21 215 lb 8 oz (97.8 kg)  04/17/21 220 lb (99.8 kg)    Physical Exam   BP 134/84   Pulse 75   Ht '5\' 5"'  (1.651 m)   Wt 213 lb (96.6 kg)   BMI 35.45 kg/m  Constitutional:  Generally well appearing female in no acute distress. Psychiatric: Pleasant. Normal mood and affect. Behavior is normal. EENT: Pupils normal.  Conjunctivae are normal. No scleral icterus. Neck supple.  Cardiovascular: Normal rate, regular rhythm. No edema Pulmonary/chest: Effort normal and breath sounds normal. No wheezing, rales or rhonchi. Abdominal: Soft, nondistended, nontender. Bowel sounds active throughout. There are no masses palpable. No hepatomegaly. Rectal: Small amount of soft light brown, heme-negative stool in vault Neurological: Alert and oriented to person place and time. Skin: Skin is warm and dry. No rashes noted.  [name], NP  11/04/2021, 8:39 AM  Cc:  Referring Provider Camillia Herter, NP

## 2021-11-05 NOTE — Progress Notes (Addendum)
I forgot to mention in my note that I scheduled her for a RUQ, I will change it to include doppler. ____________________________________________________________  Attending physician addendum:  Thank you for sending this case to me. I have reviewed the entire note and agree with the plan.  She also needs a right upper quadrant ultrasound with Doppler studies because of her elevated LFTs.  (I do not find any recent liver imaging reports)  Amada Jupiter, MD  ____________________________________________________________

## 2021-11-06 ENCOUNTER — Other Ambulatory Visit: Payer: Self-pay

## 2021-11-06 DIAGNOSIS — R748 Abnormal levels of other serum enzymes: Secondary | ICD-10-CM

## 2021-11-11 ENCOUNTER — Ambulatory Visit (HOSPITAL_COMMUNITY)
Admission: RE | Admit: 2021-11-11 | Discharge: 2021-11-11 | Disposition: A | Discharge status: Home / Self Care | Payer: No Typology Code available for payment source | Source: Ambulatory Visit | Attending: Nurse Practitioner | Admitting: Nurse Practitioner

## 2021-11-11 ENCOUNTER — Ambulatory Visit (HOSPITAL_COMMUNITY): Payer: No Typology Code available for payment source

## 2021-11-11 DIAGNOSIS — R748 Abnormal levels of other serum enzymes: Secondary | ICD-10-CM | POA: Insufficient documentation

## 2021-11-13 ENCOUNTER — Telehealth: Payer: Self-pay

## 2021-11-13 NOTE — Telephone Encounter (Signed)
-----   Message from Chrystie Nose, RN sent at 11/06/2021  9:12 AM EDT ----- Regarding: LFT's Pt needs LFT's in 1 week, order in epic.

## 2021-11-13 NOTE — Telephone Encounter (Signed)
Spoke with pt and let her know about lab work. Pt verbalized understanding and had no other concerns at end of call.

## 2021-11-16 ENCOUNTER — Other Ambulatory Visit (INDEPENDENT_AMBULATORY_CARE_PROVIDER_SITE_OTHER): Payer: No Typology Code available for payment source

## 2021-11-16 DIAGNOSIS — R748 Abnormal levels of other serum enzymes: Secondary | ICD-10-CM | POA: Diagnosis not present

## 2021-11-16 LAB — HEPATIC FUNCTION PANEL
ALT: 98 U/L — ABNORMAL HIGH (ref 0–35)
AST: 100 U/L — ABNORMAL HIGH (ref 0–37)
Albumin: 3.8 g/dL (ref 3.5–5.2)
Alkaline Phosphatase: 88 U/L (ref 39–117)
Bilirubin, Direct: 0.2 mg/dL (ref 0.0–0.3)
Total Bilirubin: 0.5 mg/dL (ref 0.2–1.2)
Total Protein: 9.5 g/dL — ABNORMAL HIGH (ref 6.0–8.3)

## 2021-11-17 ENCOUNTER — Telehealth: Payer: Self-pay | Admitting: Gastroenterology

## 2021-11-17 NOTE — Telephone Encounter (Signed)
Patient states insurance didn't cover her prep medication, please advise. Thank you

## 2021-11-17 NOTE — Telephone Encounter (Signed)
Spoke with pt and let her know we have a sample of plenvu she can come pick up, updated instructions sent to pt via mychart.

## 2021-11-19 ENCOUNTER — Encounter: Payer: Self-pay | Admitting: Gastroenterology

## 2021-11-19 ENCOUNTER — Ambulatory Visit (AMBULATORY_SURGERY_CENTER): Payer: No Typology Code available for payment source | Admitting: Gastroenterology

## 2021-11-19 VITALS — BP 119/79 | HR 69 | Temp 98.4°F | Resp 13 | Ht 65.0 in | Wt 213.0 lb

## 2021-11-19 DIAGNOSIS — K5900 Constipation, unspecified: Secondary | ICD-10-CM | POA: Diagnosis not present

## 2021-11-19 DIAGNOSIS — D509 Iron deficiency anemia, unspecified: Secondary | ICD-10-CM | POA: Diagnosis present

## 2021-11-19 DIAGNOSIS — K649 Unspecified hemorrhoids: Secondary | ICD-10-CM | POA: Diagnosis not present

## 2021-11-19 MED ORDER — SODIUM CHLORIDE 0.9 % IV SOLN: 500.0000 mL | Freq: Once | INTRAVENOUS | Status: DC

## 2021-11-19 NOTE — Patient Instructions (Addendum)
Miralax 1 capful (17 grams) in 8 oz of water PO PRN  YOU HAD AN ENDOSCOPIC PROCEDURE TODAY: Refer to the procedure report and other information in the discharge instructions given to you for any specific questions about what was found during the examination. If this information does not answer your questions, please call [place] office at [phone] to clarify.   YOU SHOULD EXPECT: Some feelings of bloating in the abdomen. Passage of more gas than usual. Walking can help get rid of the air that was put into your GI tract during the procedure and reduce the bloating. If you had a lower endoscopy (such as a colonoscopy or flexible sigmoidoscopy) you may notice spotting of blood in your stool or on the toilet paper. Some abdominal soreness may be present for a day or two, also.  DIET: Your first meal following the procedure should be a light meal and then it is ok to progress to your normal diet. A half-sandwich or bowl of soup is an example of a good first meal. Heavy or fried foods are harder to digest and may make you feel nauseous or bloated. Drink plenty of fluids but you should avoid alcoholic beverages for 24 hours. If you had a esophageal dilation, please see attached instructions for diet.    ACTIVITY: Your care partner should take you home directly after the procedure. You should plan to take it easy, moving slowly for the rest of the day. You can resume normal activity the day after the procedure however YOU SHOULD NOT DRIVE, use power tools, machinery or perform tasks that involve climbing or major physical exertion for 24 hours (because of the sedation medicines used during the test).   SYMPTOMS TO REPORT IMMEDIATELY: A gastroenterologist can be reached at any hour. Please call [phone]  for any of the following symptoms:  Following lower endoscopy (colonoscopy, flexible sigmoidoscopy) Excessive amounts of blood in the stool  Significant tenderness, worsening of abdominal pains   Swelling of the abdomen that is new, acute  Fever of 100 or higher  Following upper endoscopy (EGD, EUS, ERCP, esophageal dilation) Vomiting of blood or coffee ground material  New, significant abdominal pain  New, significant chest pain or pain under the shoulder blades  Painful or persistently difficult swallowing  New shortness of breath  Black, tarry-looking or red, bloody stools  FOLLOW UP:  If any biopsies were taken you will be contacted by phone or by letter within the next 1-3 weeks. Call [phone]  if you have not heard about the biopsies in 3 weeks.  Please also call with any specific questions about appointments or follow up tests.

## 2021-11-19 NOTE — Progress Notes (Signed)
Pt in recovery with monitors in place, VSS. Report given to receiving RN. Bite guard was placed with pt awake to ensure comfort. No dental or soft tissue damage noted. 

## 2021-11-19 NOTE — Op Note (Signed)
[facility] Patient Name: Connie Foster Procedure Date: 11/19/2021 2:51 PM MRN: [id number] Endoscopist: Sherilyn Cooter L. Myrtie Neither , MD Age: 41 Referring MD:  Date of Birth: 1981/03/21 Gender: Female Account #: [id number] Procedure:                Upper GI endoscopy Indications:              Microcytic anemia                           reported intermittent black stool - heme negative                            during recent office exam                           prior gastric sleeve                           recent Dx of Beta thallasemia Procedure:                Pre-Anesthesia Assessment:                           - Prior to the procedure, a History and Physical                            was performed, and patient medications and                            allergies were reviewed. The patient's tolerance of                            previous anesthesia was also reviewed. The risks                            and benefits of the procedure and the sedation                            options and risks were discussed with the patient.                            All questions were answered, and informed consent                            was obtained. Prior Anticoagulants: The patient has                            taken no previous anticoagulant or antiplatelet                            agents. ASA Grade Assessment: II - A patient with                            mild systemic disease. After reviewing the risks  and benefits, the patient was deemed in                            satisfactory condition to undergo the procedure.                           After obtaining informed consent, the endoscope was                            passed under direct vision. Throughout the                            procedure, the patient's blood pressure, pulse, and                            oxygen saturations were monitored continuously. The                            GIF  [id number] #[id number] was introduced through the                            mouth, and advanced to the second part of duodenum.                            The upper GI endoscopy was accomplished without                            difficulty. The patient tolerated the procedure                            well. Scope In: Scope Out: Findings:                 The esophagus was normal.                           Evidence of prior gastric sleeve procedure.                           The exam of the stomach was otherwise normal.                           The examined duodenum was normal. Complications:            No immediate complications. Estimated Blood Loss:     Estimated blood loss: none. Impression:               - Normal esophagus.                           - Normal examined duodenum.                           - No specimens collected.                           No source of blood loss or anemia seen (see  above). Recommendation:           - Patient has a contact number available for                            emergencies. The signs and symptoms of potential                            delayed complications were discussed with the                            patient. Return to normal activities tomorrow.                            Written discharge instructions were provided to the                            patient.                           - Resume previous diet.                           - Continue present medications.                           - See the other procedure note for documentation of                            additional recommendations. Densel Kronick L. Myrtie Neither, MD 11/19/2021 3:36:28 PM This report has been signed electronically.

## 2021-11-19 NOTE — Progress Notes (Signed)
Pt's states no medical or surgical changes since previsit or office visit. 

## 2021-11-19 NOTE — Progress Notes (Signed)
No changes to clinical history since GI office visit on 11/04/21.  The patient is appropriate for an endoscopic procedure in the ambulatory setting.  - Rahman Ferrall Danis, MD    

## 2021-11-19 NOTE — Op Note (Signed)
[facility] Patient Name: Connie Foster Procedure Date: 11/19/2021 2:56 PM MRN: [id number] Endoscopist: Sherilyn Cooter L. Myrtie Neither , MD Age: 41 Referring MD:  Date of Birth: [birth year] Gender: Female Account #: [id number] Procedure:                Colonoscopy Indications:              Constipation                           (no clear cause - no meds, nml TSH) Medicines:                Monitored Anesthesia Care Procedure:                Pre-Anesthesia Assessment:                           - Prior to the procedure, a History and Physical                            was performed, and patient medications and                            allergies were reviewed. The patient's tolerance of                            previous anesthesia was also reviewed. The risks                            and benefits of the procedure and the sedation                            options and risks were discussed with the patient.                            All questions were answered, and informed consent                            was obtained. Prior Anticoagulants: The patient has                            taken no previous anticoagulant or antiplatelet                            agents. ASA Grade Assessment: II - A patient with                            mild systemic disease. After reviewing the risks                            and benefits, the patient was deemed in                            satisfactory condition to undergo the procedure.  After obtaining informed consent, the colonoscope                            was passed under direct vision. Throughout the                            procedure, the patient's blood pressure, pulse, and                            oxygen saturations were monitored continuously. The                            CF [id number] #[id number] was introduced through the anus                            and advanced to the the cecum, identified by                             appendiceal orifice and ileocecal valve. The                            colonoscopy was somewhat difficult due to a                            redundant colon. Successful completion of the                            procedure was aided by using manual pressure and                            straightening and shortening the scope to obtain                            bowel loop reduction. The patient tolerated the                            procedure well. The quality of the bowel                            preparation was excellent. The ileocecal valve,                            appendiceal orifice, and rectum were photographed. Scope In: 3:06:25 PM Scope Out: 3:20:41 PM Scope Withdrawal Time: 0 hours 10 minutes 36 seconds  Total Procedure Duration: 0 hours 14 minutes 16 seconds  Findings:                 The perianal and digital rectal examinations were                            normal.                           Repeat examination of right colon under NBI  performed.                           The colon (entire examined portion) was redundant.                           Internal hemorrhoids were found.                           The exam was otherwise without abnormality on                            direct and retroflexion views. Complications:            No immediate complications. Estimated Blood Loss:     Estimated blood loss: none. Impression:               - Redundant colon.                           - Internal hemorrhoids.                           - The examination was otherwise normal on direct                            and retroflexion views.                           - No specimens collected. Recommendation:           - Patient has a contact number available for                            emergencies. The signs and symptoms of potential                            delayed complications were discussed with the                             patient. Return to normal activities tomorrow.                            Written discharge instructions were provided to the                            patient.                           - Resume previous diet.                           - Continue present medications.                           - Repeat colonoscopy in 10 years for screening                            purposes.                           -  Miralax 1 capful (17 grams) in 8 ounces of water                            PO PRN.                           - Return to physician assistant at appointment to                            be scheduled.                           - See the other procedure note for documentation of                            additional recommendations. Ilee Randleman L. Myrtie Neither, MD 11/19/2021 3:24:06 PM This report has been signed electronically.

## 2021-11-20 ENCOUNTER — Telehealth: Payer: Self-pay

## 2021-11-20 NOTE — Telephone Encounter (Signed)
Attempted f/u call. No answer, left VM. 

## 2021-11-25 ENCOUNTER — Telehealth: Payer: Self-pay

## 2021-11-25 NOTE — Telephone Encounter (Signed)
Per 11/19/21 procedure report - Return to physician assistant at appointment to be scheduled  Patient has been scheduled for a follow up appt on Wednesday, 12/23/21 at 9:15 am with Willette Cluster, NP. Appt information mailed and sent to patient via MyChart.

## 2021-12-23 ENCOUNTER — Ambulatory Visit (INDEPENDENT_AMBULATORY_CARE_PROVIDER_SITE_OTHER): Payer: No Typology Code available for payment source | Admitting: Nurse Practitioner

## 2021-12-23 ENCOUNTER — Encounter: Payer: Self-pay | Admitting: Nurse Practitioner

## 2021-12-23 ENCOUNTER — Other Ambulatory Visit: Payer: No Typology Code available for payment source

## 2021-12-23 VITALS — BP 128/82 | HR 82 | Ht 65.0 in | Wt 215.0 lb

## 2021-12-23 DIAGNOSIS — R7989 Other specified abnormal findings of blood chemistry: Secondary | ICD-10-CM

## 2021-12-23 NOTE — Patient Instructions (Signed)
Your provider has requested that you go to the basement level for lab work before leaving today. Press "B" on the elevator. The lab is located at the first door on the left as you exit the elevator.   Connie Foster will contact you once your results are in.   Due to recent changes in healthcare laws, you may see the results of your imaging and laboratory studies on MyChart before your provider has had a chance to review them.  We understand that in some cases there may be results that are confusing or concerning to you. Not all laboratory results come back in the same time frame and the provider may be waiting for multiple results in order to interpret others.  Please give [place] 48 hours in order for your provider to thoroughly review all the results before contacting the office for clarification of your results.    It was a pleasure to see you today!  Thank you for trusting me with your gastrointestinal care!

## 2021-12-23 NOTE — Progress Notes (Signed)
Chief Complaint:  Follow up on abnormal liver tests   Assessment &  Plan   #41 yo female with unexplained elevation in liver enzymes since January ( < 4xULN).  Chronic viral hepatitis studies negative, no evidence for hepatic steatosis on ultrasound with doppler, not on any medications /supplements.  Obtain complete hepatic serologic workup for further evaluation of liver enzymes.  Repeat LFTs in 4 weeks. They had improved some at last check on 8/28.   #Constipation, resolved with fiber.  Redundant colon on recent colonoscopy, otherwise unremarkable exam  #Chronic microcytic anemia, followed by Hematology.  Hemoglobin electrophoresis showed thalassemia minor.  # Mildly elevated serum protein, low A/G ratio. She sees Hematology.     HPI   Connie Foster is a 40 y.o. year old female with PMH of HTN, chronic anemia,  and a sleeve gastroplasty.  See PMH for additional history   11/04/21 Office visit.  [name] was seen here 11/04/21 for evaluation of elevated liver chemistries, intermittent black stool, chronic microcytic anemia and constipation. She was scheduled for an EGD and colonoscopy. Regarding liver enzymes, in January 2023 her AST was mildly elevated at 43 / ALT normal. In July 2023 AST was 53, ALT mildly elevated at 39.  Repeat LFTs, viral hepatitis studies, ultrasound with Doppler were ordered.  Her AST had risen to 117, ALT up to 135. Bilirubin and alkaline phosphatase normal.  Ultrasound was normal except for mild spleen enlargement.    11/16/21 -repeat LFT.  Liver enzymes had improved AST 117 >> 100 ALT 135 >> 98  Interval History:  She had her EGD and colonoscopy, see below.  Essentially both were unremarkable [name] is taking Metamucil, her constipation has improved.  Regarding elevated liver enzymes, as mentioned at our last office visit she is basically a non-drinker. She is not taking any medications.  Hepatic steatosis not seen on ultrasound.  No evidence for  chronic viral hepatitis.  Previous GI Evaluation   11/19/21 Colonoscopy  - Redundant colon. - Internal hemorrhoids. - The examination was otherwise normal on direct and retroflexion views. - No specimens collected.   11/19/21 EGD - Normal esophagus. - Normal examined duodenum. - No specimens collected.  Imaging   US liver with doppler - Normal hepatic venous Doppler.  Minor splenic enlargement  Labs:     Latest Ref Rng & Units 11/04/2021   11:08 AM 11/04/2021    9:41 AM 10/22/2021    2:47 PM  CBC  WBC 4.0 - 10.5 K/uL 3.8  3.6  4.3   Hemoglobin 12.0 - 15.0 g/dL 9.6  9.9  9.7   Hematocrit 36.0 - 46.0 % 30.3  31.8  30.6   Platelets 150 - 400 K/uL 257  255.0  260        Latest Ref Rng & Units 11/16/2021    2:33 PM 11/04/2021    9:41 AM 09/25/2021    4:03 PM  Hepatic Function  Total Protein 6.0 - 8.3 g/dL 9.5  10.0  9.5   Albumin 3.5 - 5.2 g/dL 3.8  4.0  3.8   AST 0 - 37 U/L 100  117  53   ALT 0 - 35 U/L 98  135  39   Alk Phosphatase 39 - 117 U/L 88  86  120   Total Bilirubin 0.2 - 1.2 mg/dL 0.5  0.8  0.4   Bilirubin, Direct 0.0 - 0.3 mg/dL 0.2  0.0     HCV ab negative.   Past Medical History:  Diagnosis Date   History of hypertension    Hypokalemia    Systemic lupus erythematosus ([place])     Past Surgical History:  Procedure Laterality Date   CESAREAN SECTION     SLEEVE GASTROPLASTY     TONSILLECTOMY      Current Medications, Allergies, Family History and Social History were reviewed in [employer] record.     Current Outpatient Medications  Medication Sig Dispense Refill   levonorgestrel (MIRENA, 52 MG,) 20 MCG/24HR IUD Mirena 20 mcg/24 hours (6 yrs) 52 mg intrauterine device  Take 1 device by intrauterine route.     No current facility-administered medications for this visit.    Review of Systems: No chest pain. No shortness of breath. No urinary complaints.    Physical Exam  Wt Readings from Last 3 Encounters:  12/23/21 215 lb  (97.5 kg)  11/19/21 213 lb (96.6 kg)  11/04/21 213 lb (96.6 kg)    BP 128/82   Pulse 82   Ht '5\' 5"'  (1.651 m)   Wt 215 lb (97.5 kg)   SpO2 98%   BMI 35.78 kg/m  Constitutional:  Generally well appearing female in no acute distress. Psychiatric: Pleasant. Normal mood and affect. Behavior is normal. EENT: Pupils normal.  Conjunctivae are normal. No scleral icterus. Neck supple.  Cardiovascular: Normal rate, regular rhythm. No edema Pulmonary/chest: Effort normal and breath sounds normal. No wheezing, rales or rhonchi. Abdominal: Soft, nondistended, nontender. Bowel sounds active throughout. There are no masses palpable. No hepatomegaly. Neurological: Alert and oriented to person place and time. Skin: Skin is warm and dry. No rashes noted.  [name], NP  12/23/2021, 9:39 AM

## 2021-12-24 NOTE — Progress Notes (Signed)
____________________________________________________________  Attending physician addendum:  Thank you for sending this case to me. I have reviewed the entire note and agree with the plan.  Agreed she needs a work-up for autoimmune and metabolic liver disease.  (?  AIH)  Please let me know when you have all the test results.  Wilfrid Lund, MD  ____________________________________________________________

## 2021-12-25 ENCOUNTER — Encounter: Payer: Self-pay | Admitting: Internal Medicine

## 2021-12-25 ENCOUNTER — Ambulatory Visit: Payer: No Typology Code available for payment source | Attending: Internal Medicine | Admitting: Internal Medicine

## 2021-12-25 DIAGNOSIS — R42 Dizziness and giddiness: Secondary | ICD-10-CM | POA: Diagnosis not present

## 2021-12-25 DIAGNOSIS — M25552 Pain in left hip: Secondary | ICD-10-CM

## 2021-12-25 DIAGNOSIS — R350 Frequency of micturition: Secondary | ICD-10-CM

## 2021-12-25 DIAGNOSIS — R7989 Other specified abnormal findings of blood chemistry: Secondary | ICD-10-CM | POA: Diagnosis not present

## 2021-12-25 DIAGNOSIS — M329 Systemic lupus erythematosus, unspecified: Secondary | ICD-10-CM

## 2021-12-25 NOTE — Progress Notes (Unsigned)
Office Visit Note  Patient: Connie Foster             Date of Birth: 05/13/1980           MRN: [id number]             PCP: Camillia Herter, NP Referring: Camillia Herter, NP Visit Date: 12/25/2021  Subjective:  New Patient (Initial Visit) (Liver function problem, trying to find underlying issue. Lupus diagnosis 14 years ago, no treatment and unsure about lupus. Left hip pain.)   History of Present Illness: Connie Foster is a 41 y.o. female here for history of lupus. ***   LAbs abnormal but no clinical Sx back years ago Workup due t feeling worse since about 4 months ago Vertigo, headaches new Chronic hs left hip pain Unable to exercise x1 onth, used to be very regular Constipation Dry mouth and urinary frequency   No ulcers, lymphadneotpahy, raynauds,photosensitivity - skin rash on shins a while ago that resolved with topical rx ***   Labs reviewed 12/2021 ANA pending Smooth muscle Ab pending Mitochondrial Ab pending Ttg IgA neg  10/2021 CBC WBC 3.8 Hgb 9.6 AST 100 ALT 98 SPEP polyclonal gamma globulin increase HAV Ab pos HBV sAb pos HBV sAg neg  Activities of Daily Living:  Patient reports morning stiffness for 5 minutes.   Patient Denies nocturnal pain.  Difficulty dressing/grooming: Denies Difficulty climbing stairs: Denies Difficulty getting out of chair: Denies Difficulty using hands for taps, buttons, cutlery, and/or writing: Denies  Review of Systems  Constitutional:  Positive for fatigue.  HENT:  Positive for mouth dryness. Negative for mouth sores.   Respiratory:  Negative for shortness of breath.   Cardiovascular:  Positive for palpitations. Negative for chest pain.  Gastrointestinal:  Positive for constipation. Negative for blood in stool and diarrhea.  Endocrine: Positive for increased urination.  Genitourinary:  Positive for involuntary urination.  Musculoskeletal:  Positive for joint pain, joint pain and morning stiffness. Negative for  gait problem, myalgias, muscle weakness, muscle tenderness and myalgias.  Skin:  Positive for rash. Negative for color change, hair loss and sensitivity to sunlight.  Allergic/Immunologic: Positive for susceptible to infections.  Neurological:  Positive for dizziness and headaches.  Hematological:  Positive for swollen glands.  Psychiatric/Behavioral:  Negative for depressed mood and sleep disturbance. The patient is nervous/anxious.     PMFS History:  Patient Active Problem List   Diagnosis Date Noted   Personal history of systemic lupus erythematosus (SLE) ([place]) 12/25/2021   Vertigo 12/25/2021   Pain in left hip 12/25/2021   Liver function test abnormality 12/25/2021   Urinary frequency 12/25/2021   Lung nodule    Dyspnea 03/12/2015   Hypokalemia 03/12/2015   Reactive airway disease with wheezing with acute exacerbation 03/12/2015    Past Medical History:  Diagnosis Date   History of hypertension    Hypokalemia    Systemic lupus erythematosus ([place])     Family History  Problem Relation Age of Onset   Hypertension Mother    Allergies Mother    Diabetes Mother    Healthy Father    Thyroid disease Brother    Colon cancer Maternal Uncle    Breast cancer Maternal Grandmother    Healthy Son    Esophageal cancer Neg Hx    Stomach cancer Neg Hx    Rectal cancer Neg Hx    Past Surgical History:  Procedure Laterality Date   CESAREAN SECTION     SLEEVE GASTROPLASTY  TONSILLECTOMY     Social History   Social History Narrative   Not on file   Immunization History  Administered Date(s) Administered   Influenza,inj,Quad PF,6+ Mos 12/19/2018   Influenza-Unspecified 12/22/2020   Moderna SARS-COV2 Booster Vaccination 12/24/2020   Moderna Sars-Covid-2 Vaccination 06/10/2019, 07/08/2019, 02/24/2020   Tdap 12/19/2018     Objective: Vital Signs: BP 120/80 (BP Location: Left Arm, Patient Position: Sitting, Cuff Size: Normal)   Pulse 69   Resp 15   Ht 5\' 5"  (1.651 m)    Wt 214 lb 6.4 oz (97.3 kg)   BMI 35.68 kg/m    Physical Exam Constitutional:      Appearance: She is obese.  HENT:     Mouth/Throat:     Mouth: Mucous membranes are moist.     Pharynx: Oropharynx is clear.  Eyes:     Conjunctiva/sclera: Conjunctivae normal.  Cardiovascular:     Rate and Rhythm: Normal rate and regular rhythm.  Pulmonary:     Effort: Pulmonary effort is normal.     Breath sounds: Normal breath sounds.  Musculoskeletal:     Right lower leg: No edema.     Left lower leg: No edema.  Lymphadenopathy:     Cervical: No cervical adenopathy.  Skin:    General: Skin is warm and dry.     Findings: No rash.     Comments: Normal nailfold capillaroscopy  Neurological:     General: No focal deficit present.     Mental Status: She is alert.  Psychiatric:        Mood and Affect: Mood normal.      Musculoskeletal Exam:  Neck full ROM no tenderness Shoulders full ROM no tenderness or swelling Elbows full ROM no tenderness or swelling Wrists full ROM no tenderness or swelling Fingers full ROM no tenderness or swelling Hip normal internal and external rotation without pain, mild left lateral hip tenderness to pressure Knees full ROM no tenderness or swelling Ankles full ROM no tenderness or swelling  Investigation: No additional findings.  Imaging: No results found.  Recent Labs: Lab Results  Component Value Date   WBC 3.8 (L) 11/04/2021   HGB 9.6 (L) 11/04/2021   PLT 257 11/04/2021   NA 142 09/25/2021   K 4.0 09/25/2021   CL 108 (H) 09/25/2021   CO2 22 09/25/2021   GLUCOSE 90 09/25/2021   BUN 8 09/25/2021   CREATININE 1.04 (H) 09/25/2021   BILITOT 0.5 11/16/2021   ALKPHOS 88 11/16/2021   AST 100 (H) 11/16/2021   ALT 98 (H) 11/16/2021   PROT 9.5 (H) 11/16/2021   ALBUMIN 3.8 11/16/2021   CALCIUM 8.8 09/25/2021   GFRAA >60 03/13/2015    Speciality Comments: No specialty comments available.  Procedures:  No procedures performed Allergies:  Lisinopril   Assessment / Plan:     Visit Diagnoses: Personal history of systemic lupus erythematosus (SLE) ([facility]) - Plan: Sedimentation rate, Anti-DNA antibody, double-stranded, C3 and C4, Cardiolipin antibodies, IgG, IgM, IgA, Beta-2 glycoprotein antibodies  Vertigo  Pain in left hip  Liver function test abnormality  Urinary frequency - Plan: Urinalysis, Routine w reflex microscopic  Orders: Orders Placed This Encounter  Procedures   Sedimentation rate   Anti-DNA antibody, double-stranded   C3 and C4   Cardiolipin antibodies, IgG, IgM, IgA   Urinalysis, Routine w reflex microscopic   Beta-2 glycoprotein antibodies   No orders of the defined types were placed in this encounter.   Face-to-face time spent with patient was ***  minutes. Greater than 50% of time was spent in counseling and coordination of care.  Follow-Up Instructions: Return in about 3 weeks (around 01/15/2022).   Fuller Plan, MD  Note - This record has been created using [employer].  Chart creation errors have been sought, but may not always  have been located. Such creation errors do not reflect on  the standard of medical care.

## 2021-12-29 LAB — ANA: Anti Nuclear Antibody (ANA): POSITIVE — AB

## 2021-12-29 LAB — ANTI-SMOOTH MUSCLE ANTIBODY, IGG: Actin (Smooth Muscle) Antibody (IGG): <20 U (ref <20)

## 2021-12-29 LAB — TISSUE TRANSGLUTAMINASE ABS,IGG,IGA
(tTG) Ab, IgA: <1 U/mL
(tTG) Ab, IgG: 1.6 U/mL

## 2021-12-29 LAB — ANTI-NUCLEAR AB-TITER (ANA TITER): ANA Titer 1: 1:1280 {titer} — ABNORMAL HIGH

## 2021-12-29 LAB — IGA: Immunoglobulin A: 394 mg/dL — ABNORMAL HIGH (ref 47–310)

## 2021-12-29 LAB — MITOCHONDRIAL ANTIBODIES: Mitochondrial M2 Ab, IgG: <=20 U (ref <=20.0)

## 2021-12-29 LAB — CERULOPLASMIN: Ceruloplasmin: 28 mg/dL (ref 18–53)

## 2021-12-29 LAB — ALPHA-1-ANTITRYPSIN: A-1 Antitrypsin, Ser: 108 mg/dL (ref 83–199)

## 2021-12-30 ENCOUNTER — Other Ambulatory Visit: Payer: Self-pay

## 2021-12-30 DIAGNOSIS — R7989 Other specified abnormal findings of blood chemistry: Secondary | ICD-10-CM

## 2021-12-30 DIAGNOSIS — R768 Other specified abnormal immunological findings in serum: Secondary | ICD-10-CM

## 2021-12-30 LAB — ANTI-DNA ANTIBODY, DOUBLE-STRANDED: ds DNA Ab: 1 IU/mL

## 2021-12-30 LAB — CARDIOLIPIN ANTIBODIES, IGG, IGM, IGA
Anticardiolipin IgA: <2 APL-U/mL (ref <20.0)
Anticardiolipin IgG: <2 GPL-U/mL (ref <20.0)
Anticardiolipin IgM: <2 MPL-U/mL (ref <20.0)

## 2021-12-30 LAB — URINALYSIS, ROUTINE W REFLEX MICROSCOPIC
Bilirubin Urine: NEGATIVE
Glucose, UA: NEGATIVE
Hgb urine dipstick: NEGATIVE
Ketones, ur: NEGATIVE
Leukocytes,Ua: NEGATIVE
Nitrite: NEGATIVE
Protein, ur: NEGATIVE
Specific Gravity, Urine: 1.004 (ref 1.001–1.035)
pH: 6.5 (ref 5.0–8.0)

## 2021-12-30 LAB — C3 AND C4
C3 Complement: 112 mg/dL (ref 83–193)
C4 Complement: 16 mg/dL (ref 15–57)

## 2021-12-30 LAB — BETA-2 GLYCOPROTEIN ANTIBODIES
Beta-2 Glyco 1 IgA: <2 U/mL (ref <20.0)
Beta-2 Glyco 1 IgM: <2 U/mL (ref <20.0)
Beta-2 Glyco I IgG: <2 U/mL (ref <20.0)

## 2021-12-30 LAB — SEDIMENTATION RATE: Sed Rate: 108 mm/h — ABNORMAL HIGH (ref 0–20)

## 2021-12-31 ENCOUNTER — Other Ambulatory Visit: Payer: No Typology Code available for payment source

## 2021-12-31 DIAGNOSIS — R768 Other specified abnormal immunological findings in serum: Secondary | ICD-10-CM

## 2021-12-31 DIAGNOSIS — R7989 Other specified abnormal findings of blood chemistry: Secondary | ICD-10-CM

## 2022-01-01 LAB — IGG: IgG (Immunoglobin G), Serum: 4536 mg/dL — ABNORMAL HIGH (ref 600–1640)

## 2022-01-12 NOTE — Progress Notes (Signed)
Office Visit Note  Patient: Connie Foster             Date of Birth: Apr 27, 1980           MRN: [id number]             PCP: Rema Fendt, NP Referring: Rema Fendt, NP Visit Date: 01/21/2022   Subjective:   History of Present Illness: Connie Foster is a 41 y.o. female here for follow up for history of lupus with recent onset of more symptoms and also getting evaluation for possible autoimmune hepatitis.  Laboratory testing at our visit was negative for lupus specific antibody markers but did have elevated sedimentation rate of 108.  Also had IgG serum level checked after that which was over 4500.  Overall findings were concerning and has been scheduled for planned liver biopsy next week.  Her symptoms remain basically the same without any change in the past few weeks.  Previous HPI 12/25/2021 Connie Foster is a 41 y.o. female here for history of lupus.  This original diagnosis was 14 years ago on the basis of abnormal lab findings but did not have signs or symptomatic complaints at the time so was never started on any treatment.  She was not seeing a rheumatologist at the time.  More recently had some repeat work-up being evaluated due to feeling worse with new symptoms since about 4 months ago.  This includes evaluation in gastroenterology clinic with elevated liver enzymes which are new.  She has new problems with headache and vertigo symptoms.  Left hip pain is a more chronic problem but has been increased more than usual.  She is experiencing constipation usually without severe abdominal pain or other GI issues.  She does describe a sensation of dry mouth and increasing urinary frequency compared to her usual.  Overall exertion tolerance is worse than before.  For the past 1 month symptoms have been bad enough that she is stopped doing any routine exercise she previously had been very regular with multiple times per week aerobic exercises.  She had a skin rash on the shins  recently that was treated with topical prescription medication and resolves before now.  She denies any issues with oral or nasal ulcers, lymphadenopathy, Raynaud's symptoms, or photosensitive skin rashes.   Labs reviewed 12/2021 ANA 1:1280 fine speckled Smooth muscle Ab neg Mitochondrial Ab neg Ttg IgA neg   10/2021 CBC WBC 3.8 Hgb 9.6 AST 100 ALT 98 SPEP polyclonal gamma globulin increase HAV Ab pos HBV sAb pos HBV sAg neg   Activities of Daily Living:  Patient reports morning stiffness for 5 minutes.   Patient Denies nocturnal pain.  Difficulty dressing/grooming: Denies Difficulty climbing stairs: Denies Difficulty getting out of chair: Denies Difficulty using hands for taps, buttons, cutlery, and/or writing: Denies   Review of Systems  Constitutional:  Positive for fatigue.  HENT:  Positive for mouth dryness. Negative for mouth sores.   Eyes:  Positive for dryness.  Respiratory:  Positive for shortness of breath.   Cardiovascular:  Positive for palpitations. Negative for chest pain.  Gastrointestinal:  Positive for constipation. Negative for blood in stool and diarrhea.  Endocrine: Positive for increased urination.  Genitourinary:  Positive for involuntary urination.  Musculoskeletal:  Positive for myalgias, morning stiffness, muscle tenderness and myalgias. Negative for joint pain, gait problem, joint pain, joint swelling and muscle weakness.  Skin:  Negative for color change, rash, hair loss and sensitivity to sunlight.  Allergic/Immunologic: Negative for susceptible  to infections.  Neurological:  Positive for dizziness and headaches.  Hematological:  Negative for swollen glands.  Psychiatric/Behavioral:  Positive for sleep disturbance. Negative for depressed mood. The patient is nervous/anxious.     PMFS History:  Patient Active Problem List   Diagnosis Date Noted   Personal history of systemic lupus erythematosus (SLE) ([facility]) 12/25/2021   Vertigo 12/25/2021    Pain in left hip 12/25/2021   Liver function test abnormality 12/25/2021   Urinary frequency 12/25/2021   Lung nodule    Dyspnea 03/12/2015   Hypokalemia 03/12/2015   Reactive airway disease with wheezing with acute exacerbation 03/12/2015    Past Medical History:  Diagnosis Date   History of hypertension    Hypokalemia    Systemic lupus erythematosus ([facility])     Family History  Problem Relation Age of Onset   Hypertension Mother    Allergies Mother    Diabetes Mother    Healthy Father    Thyroid disease Brother    Colon cancer Maternal Uncle    Breast cancer Maternal Grandmother    Healthy Son    Esophageal cancer Neg Hx    Stomach cancer Neg Hx    Rectal cancer Neg Hx    Past Surgical History:  Procedure Laterality Date   CESAREAN SECTION     SLEEVE GASTROPLASTY     TONSILLECTOMY     Social History   Social History Narrative   Not on file   Immunization History  Administered Date(s) Administered   Influenza,inj,Quad PF,6+ Mos 12/19/2018   Influenza-Unspecified 12/22/2020   Moderna SARS-COV2 Booster Vaccination 12/24/2020   Moderna Sars-Covid-2 Vaccination 06/10/2019, 07/08/2019, 02/24/2020   Tdap 12/19/2018     Objective: Vital Signs: BP 120/81 (BP Location: Right Arm, Patient Position: Sitting, Cuff Size: Large)   Pulse 73   Resp 16   Ht 5\' 5"  (1.651 m)   Wt 220 lb (99.8 kg)   BMI 36.61 kg/m    Physical Exam Constitutional:      Appearance: She is obese.  Cardiovascular:     Rate and Rhythm: Normal rate and regular rhythm.  Pulmonary:     Effort: Pulmonary effort is normal.     Breath sounds: Normal breath sounds.  Musculoskeletal:     Right lower leg: No edema.     Left lower leg: No edema.  Skin:    General: Skin is warm and dry.     Findings: No rash.  Neurological:     Mental Status: She is alert.  Psychiatric:        Mood and Affect: Mood normal.      Musculoskeletal Exam:  Shoulders full ROM no tenderness or swelling Elbows full  ROM no tenderness or swelling Wrists full ROM no tenderness or swelling Fingers full ROM no tenderness or swelling Knees full ROM no tenderness or swelling  Investigation: No additional findings.  Imaging: No results found.  Recent Labs: Lab Results  Component Value Date   WBC 3.8 (L) 11/04/2021   HGB 9.6 (L) 11/04/2021   PLT 257 11/04/2021   NA 142 09/25/2021   K 4.0 09/25/2021   CL 108 (H) 09/25/2021   CO2 22 09/25/2021   GLUCOSE 90 09/25/2021   BUN 8 09/25/2021   CREATININE 1.04 (H) 09/25/2021   BILITOT 0.5 11/16/2021   ALKPHOS 88 11/16/2021   AST 100 (H) 11/16/2021   ALT 98 (H) 11/16/2021   PROT 9.5 (H) 11/16/2021   ALBUMIN 3.8 11/16/2021   CALCIUM 8.8 09/25/2021  GFRAA >60 03/13/2015    Speciality Comments: No specialty comments available.  Procedures:  No procedures performed Allergies: Lisinopril   Assessment / Plan:     Visit Diagnoses: Personal history of systemic lupus erythematosus (SLE) ([place]) Liver function test abnormality  Has upcoming liver clinic evaluation and biopsy planned next week.  If this identifies autoimmune hepatitis would likely explain most of the abnormal serology as well more precisely than possible history of lupus.  If this work-up does not result in starting a treatment regiment for liver inflammation discussed possibly starting on hydroxychloroquine and reviewed this medication today.  We will hold off on any new treatment trial pending her upcoming evaluation.  Urinary frequency  No evidence on labs suggesting renal involvement from inflammatory disease as a cause of urinary symptoms.  Continues somewhat increased but nonspecific.  Orders: No orders of the defined types were placed in this encounter.  No orders of the defined types were placed in this encounter.    Follow-Up Instructions: Return in about 3 months (around 04/23/2022) for AIH or SLE? maybe HCQ start f/u 63mos.   Collier Salina, MD  Note - This record has  been created using [employer].  Chart creation errors have been sought, but may not always  have been located. Such creation errors do not reflect on  the standard of medical care.

## 2022-01-15 ENCOUNTER — Other Ambulatory Visit: Payer: Self-pay

## 2022-01-15 ENCOUNTER — Telehealth: Payer: Self-pay

## 2022-01-15 DIAGNOSIS — R768 Other specified abnormal immunological findings in serum: Secondary | ICD-10-CM

## 2022-01-15 DIAGNOSIS — R7989 Other specified abnormal findings of blood chemistry: Secondary | ICD-10-CM

## 2022-01-15 NOTE — Telephone Encounter (Signed)
-----   Message from Willia Craze, NP sent at 01/14/2022  4:04 PM EDT ----- Mickel Baas,  Please let patient know that her elevated liver tests are possibly related to autoimmune hepatitis which is treatable.  If she is willing to undergo a liver biopsy that will help decide on treatment. If she is willing, please schedule a liver biopsy with IR  Also, she needs a referral to the [facility] hepatology clinic for abnormal liver enzymes, suspected autoimmune hepatitis.    Thanks

## 2022-01-15 NOTE — Telephone Encounter (Signed)
Called pt to let her know about recommendations. Pt agreed to liver biopsy. Orders placed and referral faxed to [facility] at [phone]

## 2022-01-21 ENCOUNTER — Ambulatory Visit: Payer: No Typology Code available for payment source | Attending: Internal Medicine | Admitting: Internal Medicine

## 2022-01-21 ENCOUNTER — Encounter: Payer: Self-pay | Admitting: Internal Medicine

## 2022-01-21 VITALS — BP 120/81 | HR 73 | Resp 16 | Ht 65.0 in | Wt 220.0 lb

## 2022-01-21 DIAGNOSIS — M329 Systemic lupus erythematosus, unspecified: Secondary | ICD-10-CM | POA: Diagnosis not present

## 2022-01-21 DIAGNOSIS — M25552 Pain in left hip: Secondary | ICD-10-CM

## 2022-01-21 DIAGNOSIS — R7989 Other specified abnormal findings of blood chemistry: Secondary | ICD-10-CM

## 2022-01-21 DIAGNOSIS — R350 Frequency of micturition: Secondary | ICD-10-CM

## 2022-01-21 NOTE — Patient Instructions (Signed)
Hydroxychloroquine Tablets What is this medication? HYDROXYCHLOROQUINE (hye drox ee KLOR oh kwin) treats autoimmune conditions, such as rheumatoid arthritis and lupus. It works by slowing down an overactive immune system. It may also be used to prevent and treat malaria. It works by killing the parasite that causes malaria. It belongs to a group of medications called DMARDs. This medicine may be used for other purposes; ask your health care provider or pharmacist if you have questions. COMMON BRAND NAME(S): Plaquenil, Quineprox What should I tell my care team before I take this medication? They need to know if you have any of these conditions: Diabetes Eye disease, vision problems Frequently drink alcohol G6PD deficiency Heart disease Irregular heartbeat or rhythm Kidney disease Liver disease Porphyria Psoriasis An unusual or allergic reaction to hydroxychloroquine, other medications, foods, dyes, or preservatives Pregnant or trying to get pregnant Breastfeeding How should I use this medication? Take this medication by mouth with water. Take it as directed on the prescription label. Do not cut, crush, or chew this medication. Swallow the tablets whole. Take it with food. Do not take it more than directed. Take all of this medication unless your care team tells you to stop it early. Keep taking it even if you think you are better. Take products with antacids in them at a different time of day than this medication. Take this medication 4 hours before or 4 hours after antacids. Talk to your care team if you have questions. Talk to your care team about the use of this medication in children. While this medication may be prescribed for selected conditions, precautions do apply. Overdosage: If you think you have taken too much of this medicine contact a poison control center or emergency room at once. NOTE: This medicine is only for you. Do not share this medicine with others. What if I miss a  dose? If you miss a dose, take it as soon as you can. If it is almost time for your next dose, take only that dose. Do not take double or extra doses. What may interact with this medication? Do not take this medication with any of the following: Cisapride Dronedarone Pimozide Thioridazine This medication may also interact with the following: Ampicillin Antacids Cimetidine Cyclosporine Digoxin Kaolin Medications for diabetes, such as insulin, glipizide, glyburide Medications for seizures, such as carbamazepine, phenobarbital, phenytoin Mefloquine Methotrexate Other medications that cause heart rhythm changes Praziquantel This list may not describe all possible interactions. Give your health care provider a list of all the medicines, herbs, non-prescription drugs, or dietary supplements you use. Also tell them if you smoke, drink alcohol, or use illegal drugs. Some items may interact with your medicine. What should I watch for while using this medication? Visit your care team for regular checks on your progress. Tell your care team if your symptoms do not start to get better or if they get worse. You may need blood work done while you are taking this medication. If you take other medications that can affect heart rhythm, you may need more testing. Talk to your care team if you have questions. Your vision may be tested before and during use of this medication. Tell your care team right away if you have any change in your eyesight. This medication may cause serious skin reactions. They can happen weeks to months after starting the medication. Contact your care team right away if you notice fevers or flu-like symptoms with a rash. The rash may be red or purple and then turn   into blisters or peeling of the skin. Or, you might notice a red rash with swelling of the face, lips or lymph nodes in your neck or under your arms. If you or your family notice any changes in your behavior, such as new or  worsening depression, thoughts of harming yourself, anxiety, or other unusual or disturbing thoughts, or memory loss, call your care team right away. What side effects may I notice from receiving this medication? Side effects that you should report to your care team as soon as possible: Allergic reactions--skin rash, itching, hives, swelling of the face, lips, tongue, or throat Aplastic anemia--unusual weakness or fatigue, dizziness, headache, trouble breathing, increased bleeding or bruising Change in vision Heart rhythm changes--fast or irregular heartbeat, dizziness, feeling faint or lightheaded, chest pain, trouble breathing Infection--fever, chills, cough, or sore throat Low blood sugar (hypoglycemia)--tremors or shaking, anxiety, sweating, cold or clammy skin, confusion, dizziness, rapid heartbeat Muscle injury--unusual weakness or fatigue, muscle pain, dark yellow or brown urine, decrease in amount of urine Pain, tingling, or numbness in the hands or feet Rash, fever, and swollen lymph nodes Redness, blistering, peeling, or loosening of the skin, including inside the mouth Thoughts of suicide or self-harm, worsening mood, or feelings of depression Unusual bruising or bleeding Side effects that usually do not require medical attention (report to your care team if they continue or are bothersome): Diarrhea Headache Nausea Stomach pain Vomiting This list may not describe all possible side effects. Call your doctor for medical advice about side effects. You may report side effects to FDA at 1-800-FDA-1088. Where should I keep my medication? Keep out of the reach of children and pets. Store at room temperature up to 30 degrees C (86 degrees F). Protect from light. Get rid of any unused medication after the expiration date. To get rid of medications that are no longer needed or have expired: Take the medication to a medication take-back program. Check with your pharmacy or law enforcement  to find a location. If you cannot return the medication, check the label or package insert to see if the medication should be thrown out in the garbage or flushed down the toilet. If you are not sure, ask your care team. If it is safe to put it in the trash, empty the medication out of the container. Mix the medication with cat litter, dirt, coffee grounds, or other unwanted substance. Seal the mixture in a bag or container. Put it in the trash. NOTE: This sheet is a summary. It may not cover all possible information. If you have questions about this medicine, talk to your doctor, pharmacist, or health care provider.  2023 Elsevier/Gold Standard (2004-05-19 00:00:00)  

## 2022-01-27 ENCOUNTER — Other Ambulatory Visit (HOSPITAL_COMMUNITY): Payer: Self-pay | Admitting: Physician Assistant

## 2022-01-27 ENCOUNTER — Other Ambulatory Visit: Payer: Self-pay | Admitting: Radiology

## 2022-01-27 DIAGNOSIS — K754 Autoimmune hepatitis: Secondary | ICD-10-CM

## 2022-01-28 ENCOUNTER — Ambulatory Visit (HOSPITAL_COMMUNITY)
Admission: RE | Admit: 2022-01-28 | Discharge: 2022-01-28 | Disposition: A | Discharge status: Home / Self Care | Payer: No Typology Code available for payment source | Source: Ambulatory Visit | Attending: Nurse Practitioner | Admitting: Nurse Practitioner

## 2022-01-28 ENCOUNTER — Encounter (HOSPITAL_COMMUNITY): Payer: Self-pay

## 2022-01-28 ENCOUNTER — Ambulatory Visit: Payer: No Typology Code available for payment source | Admitting: Hematology and Oncology

## 2022-01-28 DIAGNOSIS — I10 Essential (primary) hypertension: Secondary | ICD-10-CM | POA: Diagnosis not present

## 2022-01-28 DIAGNOSIS — Z8249 Family history of ischemic heart disease and other diseases of the circulatory system: Secondary | ICD-10-CM | POA: Insufficient documentation

## 2022-01-28 DIAGNOSIS — M329 Systemic lupus erythematosus, unspecified: Secondary | ICD-10-CM | POA: Insufficient documentation

## 2022-01-28 DIAGNOSIS — R7689 Other specified abnormal immunological findings in serum: Secondary | ICD-10-CM

## 2022-01-28 DIAGNOSIS — R768 Other specified abnormal immunological findings in serum: Secondary | ICD-10-CM | POA: Insufficient documentation

## 2022-01-28 DIAGNOSIS — K754 Autoimmune hepatitis: Secondary | ICD-10-CM

## 2022-01-28 DIAGNOSIS — R7989 Other specified abnormal findings of blood chemistry: Secondary | ICD-10-CM

## 2022-01-28 LAB — CBC
HCT: 29.6 % — ABNORMAL LOW (ref 36.0–46.0)
Hemoglobin: 9.1 g/dL — ABNORMAL LOW (ref 12.0–15.0)
MCH: 24.6 pg — ABNORMAL LOW (ref 26.0–34.0)
MCHC: 30.7 g/dL (ref 30.0–36.0)
MCV: 80 fL (ref 80.0–100.0)
Platelets: 299 10*3/uL (ref 150–400)
RBC: 3.7 MIL/uL — ABNORMAL LOW (ref 3.87–5.11)
RDW: 14.6 % (ref 11.5–15.5)
WBC: 4.8 10*3/uL (ref 4.0–10.5)
nRBC: 0 % (ref 0.0–0.2)

## 2022-01-28 LAB — PROTIME-INR
INR: 1.1 (ref 0.8–1.2)
Prothrombin Time: 14.3 seconds (ref 11.4–15.2)

## 2022-01-28 LAB — PREGNANCY, URINE: Preg Test, Ur: NEGATIVE

## 2022-01-28 MED ORDER — SODIUM CHLORIDE 0.9 % IV SOLN: INTRAVENOUS | Status: DC

## 2022-01-28 MED ORDER — LIDOCAINE HCL (PF) 1 % IJ SOLN
INTRAMUSCULAR | Status: AC
  Filled 2022-01-28: qty 30

## 2022-01-28 MED ORDER — GELATIN ABSORBABLE 12-7 MM EX MISC: 1.0000 | Freq: Once | CUTANEOUS | Status: DC

## 2022-01-28 MED ORDER — FENTANYL CITRATE (PF) 100 MCG/2ML IJ SOLN
INTRAMUSCULAR | Status: AC | PRN
  Administered 2022-01-28 (×2): 50 ug via INTRAVENOUS

## 2022-01-28 MED ORDER — MIDAZOLAM HCL 2 MG/2ML IJ SOLN
INTRAMUSCULAR | Status: AC
  Filled 2022-01-28: qty 2

## 2022-01-28 MED ORDER — FENTANYL CITRATE (PF) 100 MCG/2ML IJ SOLN
INTRAMUSCULAR | Status: AC
  Filled 2022-01-28: qty 2

## 2022-01-28 MED ORDER — MIDAZOLAM HCL 2 MG/2ML IJ SOLN
INTRAMUSCULAR | Status: AC | PRN
  Administered 2022-01-28 (×2): 1 mg via INTRAVENOUS

## 2022-01-28 MED ORDER — LIDOCAINE HCL (PF) 1 % IJ SOLN: 10.0000 mL | Freq: Once | INTRAMUSCULAR | Status: DC

## 2022-01-28 MED ORDER — GELATIN ABSORBABLE 12-7 MM EX MISC
CUTANEOUS | Status: AC
  Filled 2022-01-28: qty 1

## 2022-01-28 NOTE — H&P (Signed)
Chief Complaint: Patient was seen in consultation today for elevated LFTs  Referring Physician(s): Meredith Pel  Supervising Physician: Mir, Mauri Reading  Patient Status: [facility] - Out-pt  History of Present Illness: Connie Foster is a 41 y.o. female with past medical history of lupus, HTN who has been undergoing outpatient eval for possible autoimmune hepatitis.  She is referred to IR for random liver biopsy at the request of Willette Cluster, NP.    Connie Foster presents to [facility] Radiology today in her usual state of health.  She denies fever, chills, nausea, abdominal pain, dysuria.  She has been NPO.  Her mother is available for transportation and care at home where she lives with her husband.  She is understanding of the goals of the procedure and is agreeable to proceed.  Past Medical History:  Diagnosis Date   History of hypertension    Hypokalemia    Systemic lupus erythematosus (HCC)     Past Surgical History:  Procedure Laterality Date   CESAREAN SECTION     SLEEVE GASTROPLASTY     TONSILLECTOMY      Allergies: Lisinopril  Medications: Prior to Admission medications   Medication Sig Start Date End Date Taking? Authorizing Provider  acetaminophen (TYLENOL) 500 MG tablet Take 1,000 mg by mouth every 6 (six) hours as needed for moderate pain or headache.   Yes [provider]  levonorgestrel (MIRENA, 52 MG,) 20 MCG/24HR IUD Mirena 20 mcg/24 hours (6 yrs) 52 mg intrauterine device  Take 1 device by intrauterine route.   Yes [provider]     Family History  Problem Relation Age of Onset   Hypertension Mother    Allergies Mother    Diabetes Mother    Healthy Father    Thyroid disease Brother    Colon cancer Maternal Uncle    Breast cancer Maternal Grandmother    Healthy Son    Esophageal cancer Neg Hx    Stomach cancer Neg Hx    Rectal cancer Neg Hx     Social History   Socioeconomic History   Marital status: Married    Spouse  name: Not on file   Number of children: 1   Years of education: college   Highest education level: Bachelor's degree (e.g., BA, AB, BS)  Occupational History   Not on file  Tobacco Use   Smoking status: Never    Passive exposure: Never   Smokeless tobacco: Never  Vaping Use   Vaping Use: Never used  Substance and Sexual Activity   Alcohol use: Yes    Comment: on special occasions   Drug use: No   Sexual activity: Yes    Birth control/protection: Pill  Other Topics Concern   Not on file  Social History Narrative   Not on file   Social Determinants of Health   Financial Resource Strain: Not on file  Food Insecurity: Not on file  Transportation Needs: Not on file  Physical Activity: Not on file  Stress: Not on file  Social Connections: Not on file     Review of Systems: A 12 point ROS discussed and pertinent positives are indicated in the HPI above.  All other systems are negative.  Review of Systems  Constitutional:  Negative for fatigue and fever.  Respiratory:  Negative for cough and shortness of breath.   Cardiovascular:  Negative for chest pain.  Gastrointestinal:  Negative for abdominal distention, nausea and vomiting.  Musculoskeletal:  Negative for back pain.  Psychiatric/Behavioral:  Negative  for behavioral problems and confusion.     Vital Signs: BP 116/83 (BP Location: Left Arm)   Pulse 65   Temp (!) 97.5 F (36.4 C) (Tympanic)   Resp 15   Ht 5\' 5"  (1.651 m)   Wt 212 lb (96.2 kg)   LMP  (LMP Unknown)   SpO2 100%   BMI 35.28 kg/m   Physical Exam Vitals and nursing note reviewed.  Constitutional:      General: She is not in acute distress.    Appearance: Normal appearance. She is not ill-appearing.  HENT:     Mouth/Throat:     Mouth: Mucous membranes are moist.     Pharynx: Oropharynx is clear.  Cardiovascular:     Rate and Rhythm: Normal rate and regular rhythm.  Pulmonary:     Effort: Pulmonary effort is normal.     Breath sounds: Normal  breath sounds.  Skin:    General: Skin is warm and dry.  Neurological:     General: No focal deficit present.     Mental Status: She is alert and oriented to person, place, and time. Mental status is at baseline.  Psychiatric:        Mood and Affect: Mood normal.        Behavior: Behavior normal.        Thought Content: Thought content normal.        Judgment: Judgment normal.      MD Evaluation Airway: WNL Heart: WNL Abdomen: WNL Chest/ Lungs: WNL ASA  Classification: 3 Mallampati/Airway Score: Two   Imaging: No results found.  Labs:  CBC: Recent Labs    10/22/21 1447 11/04/21 0941 11/04/21 1108 01/28/22 1146  WBC 4.3 3.6* 3.8* 4.8  HGB 9.7* 9.9* 9.6* 9.1*  HCT 30.6* 31.8* 30.3* 29.6*  PLT 260 255.0 257 299    COAGS: Recent Labs    01/28/22 1146  INR 1.1    BMP: Recent Labs    04/17/21 1536 09/25/21 1603  NA 139 142  K 4.2 4.0  CL 107* 108*  CO2 21 22  GLUCOSE 81 90  BUN 10 8  CALCIUM 8.7 8.8  CREATININE 1.00 1.04*    LIVER FUNCTION TESTS: Recent Labs    04/17/21 1536 09/25/21 1603 11/04/21 0941 11/16/21 1433  BILITOT 0.5 0.4 0.8 0.5  AST 43* 53* 117* 100*  ALT 28 39* 135* 98*  ALKPHOS 121 120 86 88  PROT 8.8* 9.5* 10.0* 9.5*  ALBUMIN 4.2 3.8 4.0 3.8    TUMOR MARKERS: No results for input(s): "AFPTM", "CEA", "CA199", "CHROMGRNA" in the last 8760 hours.  Assessment and Plan: Patient with past medical history of lupus presents with complaint of elevated LFTs, concern for autoimmune hepatitis.  IR consulted for random liver biopsy at the request of 11/18/21, NP. Case reviewed by Dr. Willette Cluster who approves patient for procedure.  Patient presents today in their usual state of health.  She has been NPO and is not currently on blood thinners.   Risks and benefits was discussed with the patient and/or patient's family including, but not limited to bleeding, infection, damage to adjacent structures or low yield requiring additional  tests.  All of the questions were answered and there is agreement to proceed.  Consent signed and in chart.  Thank you for this interesting consult.  I greatly enjoyed meeting [name] and look forward to participating in their care.  A copy of this report was sent to the requesting provider on this date.  Electronically Signed: Hoyt Koch, PA 01/28/2022, 12:52 PM   I spent a total of  30 Minutes   in face to face in clinical consultation, greater than 50% of which was counseling/coordinating care for elevated LFTs

## 2022-01-28 NOTE — Procedures (Signed)
Interventional Radiology Procedure Note  Procedure: US guided random liver biopsy  Indication: Autoimmune Hepatitis   Findings: Please refer to procedural dictation for full description.  Complications: None  EBL: < 10 mL  Acquanetta Belling, MD [phone]

## 2022-01-29 ENCOUNTER — Encounter (HOSPITAL_COMMUNITY): Payer: Self-pay

## 2022-01-29 ENCOUNTER — Emergency Department (HOSPITAL_COMMUNITY)
Admission: EM | Admit: 2022-01-29 | Discharge: 2022-01-29 | Disposition: A | Discharge status: Home / Self Care | Payer: No Typology Code available for payment source | Attending: Emergency Medicine | Admitting: Emergency Medicine

## 2022-01-29 ENCOUNTER — Emergency Department (HOSPITAL_COMMUNITY): Payer: No Typology Code available for payment source

## 2022-01-29 ENCOUNTER — Other Ambulatory Visit: Payer: Self-pay

## 2022-01-29 DIAGNOSIS — R110 Nausea: Secondary | ICD-10-CM | POA: Insufficient documentation

## 2022-01-29 DIAGNOSIS — R0602 Shortness of breath: Secondary | ICD-10-CM | POA: Diagnosis not present

## 2022-01-29 DIAGNOSIS — R509 Fever, unspecified: Secondary | ICD-10-CM | POA: Diagnosis not present

## 2022-01-29 DIAGNOSIS — E876 Hypokalemia: Secondary | ICD-10-CM | POA: Insufficient documentation

## 2022-01-29 DIAGNOSIS — R519 Headache, unspecified: Secondary | ICD-10-CM | POA: Diagnosis not present

## 2022-01-29 DIAGNOSIS — R1010 Upper abdominal pain, unspecified: Secondary | ICD-10-CM | POA: Diagnosis present

## 2022-01-29 DIAGNOSIS — Z20822 Contact with and (suspected) exposure to covid-19: Secondary | ICD-10-CM | POA: Diagnosis not present

## 2022-01-29 DIAGNOSIS — R079 Chest pain, unspecified: Secondary | ICD-10-CM | POA: Insufficient documentation

## 2022-01-29 LAB — COMPREHENSIVE METABOLIC PANEL
ALT: 21 U/L (ref 0–44)
AST: 35 U/L (ref 15–41)
Albumin: 3.3 g/dL — ABNORMAL LOW (ref 3.5–5.0)
Alkaline Phosphatase: 61 U/L (ref 38–126)
Anion gap: 6 (ref 5–15)
BUN: 14 mg/dL (ref 6–20)
CO2: 19 mmol/L — ABNORMAL LOW (ref 22–32)
Calcium: 8.7 mg/dL — ABNORMAL LOW (ref 8.9–10.3)
Chloride: 110 mmol/L (ref 98–111)
Creatinine, Ser: 1.09 mg/dL — ABNORMAL HIGH (ref 0.44–1.00)
GFR, Estimated: >60 mL/min (ref >60)
Glucose, Bld: 101 mg/dL — ABNORMAL HIGH (ref 70–99)
Potassium: 3.2 mmol/L — ABNORMAL LOW (ref 3.5–5.1)
Sodium: 135 mmol/L (ref 135–145)
Total Bilirubin: 0.7 mg/dL (ref 0.3–1.2)
Total Protein: 9.4 g/dL — ABNORMAL HIGH (ref 6.5–8.1)

## 2022-01-29 LAB — CBC WITH DIFFERENTIAL/PLATELET
Abs Immature Granulocytes: 0.01 10*3/uL (ref 0.00–0.07)
Basophils Absolute: 0 10*3/uL (ref 0.0–0.1)
Basophils Relative: 0 %
Eosinophils Absolute: 0 10*3/uL (ref 0.0–0.5)
Eosinophils Relative: 0 %
HCT: 27.1 % — ABNORMAL LOW (ref 36.0–46.0)
Hemoglobin: 8.6 g/dL — ABNORMAL LOW (ref 12.0–15.0)
Immature Granulocytes: 0 %
Lymphocytes Relative: 18 %
Lymphs Abs: 1.3 10*3/uL (ref 0.7–4.0)
MCH: 24.9 pg — ABNORMAL LOW (ref 26.0–34.0)
MCHC: 31.7 g/dL (ref 30.0–36.0)
MCV: 78.6 fL — ABNORMAL LOW (ref 80.0–100.0)
Monocytes Absolute: 0.9 10*3/uL (ref 0.1–1.0)
Monocytes Relative: 13 %
Neutro Abs: 4.9 10*3/uL (ref 1.7–7.7)
Neutrophils Relative %: 69 %
Platelets: 289 10*3/uL (ref 150–400)
RBC: 3.45 MIL/uL — ABNORMAL LOW (ref 3.87–5.11)
RDW: 14.8 % (ref 11.5–15.5)
WBC: 7.2 10*3/uL (ref 4.0–10.5)
nRBC: 0 % (ref 0.0–0.2)

## 2022-01-29 LAB — RESP PANEL BY RT-PCR (FLU A&B, COVID) ARPGX2
Influenza A by PCR: NEGATIVE
Influenza B by PCR: NEGATIVE
SARS Coronavirus 2 by RT PCR: NEGATIVE

## 2022-01-29 LAB — I-STAT BETA HCG BLOOD, ED (MC, WL, AP ONLY): I-stat hCG, quantitative: 18.9 m[IU]/mL — ABNORMAL HIGH (ref <5)

## 2022-01-29 LAB — TROPONIN I (HIGH SENSITIVITY)
Troponin I (High Sensitivity): 5 ng/L (ref <18)
Troponin I (High Sensitivity): 5 ng/L (ref <18)

## 2022-01-29 LAB — POC URINE PREG, ED: Preg Test, Ur: NEGATIVE

## 2022-01-29 MED ORDER — OXYCODONE-ACETAMINOPHEN 5-325 MG PO TABS: 1.0000 | ORAL_TABLET | Freq: Four times a day (QID) | ORAL | 0 refills | Status: DC | PRN

## 2022-01-29 MED ORDER — ONDANSETRON 4 MG PO TBDP: 4.0000 mg | ORAL_TABLET | Freq: Three times a day (TID) | ORAL | 0 refills | Status: DC | PRN

## 2022-01-29 MED ORDER — ONDANSETRON HCL 4 MG/2ML IJ SOLN
4.0000 mg | Freq: Once | INTRAMUSCULAR | Status: AC
  Administered 2022-01-29: 4 mg via INTRAVENOUS
  Filled 2022-01-29: qty 2

## 2022-01-29 MED ORDER — IOHEXOL 350 MG/ML SOLN
70.0000 mL | Freq: Once | INTRAVENOUS | Status: AC | PRN
  Administered 2022-01-29: 70 mL via INTRAVENOUS

## 2022-01-29 MED ORDER — SODIUM CHLORIDE 0.9 % IV BOLUS
500.0000 mL | Freq: Once | INTRAVENOUS | Status: AC
  Administered 2022-01-29: 500 mL via INTRAVENOUS

## 2022-01-29 MED ORDER — HYDROMORPHONE HCL 1 MG/ML IJ SOLN
0.5000 mg | Freq: Once | INTRAMUSCULAR | Status: AC
  Administered 2022-01-29: 0.5 mg via INTRAVENOUS
  Filled 2022-01-29: qty 1

## 2022-01-29 MED ORDER — ACETAMINOPHEN 325 MG PO TABS
650.0000 mg | ORAL_TABLET | Freq: Once | ORAL | Status: AC
  Administered 2022-01-29: 650 mg via ORAL
  Filled 2022-01-29: qty 2

## 2022-01-29 NOTE — ED Provider Triage Note (Signed)
Emergency Medicine Provider Triage Evaluation Note  Willamae Morine , a 41 y.o. female  was evaluated in triage.  Pt complains of SOB and chest pain.  Had liver biopsy yesterday due to persistently elevated LFT's and since then worsening pain and trouble with taking a deep breath.  Denies any known complications during procedure.  No hx of DVT or PE.  Review of Systems  Positive: CP, SOB Negative: fever  Physical Exam  BP 121/85 (BP Location: Right Arm)   Pulse 96   Temp 98.7 F (37.1 C) (Oral)   Resp 18   Ht 5\' 5"  (1.651 m)   Wt 96.2 kg   LMP  (LMP Unknown)   SpO2 94%   BMI 35.28 kg/m   Gen:   Awake, no distress   Resp:  Normal effort, appears to be splinting on exam due to pain MSK:   Moves extremities without difficulty  Other:    Medical Decision Making  Medically screening exam initiated at 5:11 AM.  Appropriate orders placed.  Shanik Manocchio was informed that the remainder of the evaluation will be completed by another provider, this initial triage assessment does not replace that evaluation, and the importance of remaining in the ED until their evaluation is complete.  CP and SOB s/p liver biopsy yesterday.  VSS in triage but appears to be splinting due to pain.  Will check EKG, labs, CXR.   , PA-C 01/29/22 [phone]

## 2022-01-29 NOTE — Discharge Instructions (Signed)
Please read and follow all provided instructions.  Your diagnoses today include:  1. Upper abdominal pain   2. Fever, unspecified fever cause     Tests performed today include: Blood cell counts and platelets: Normal infection fighting cell count, anemia close to previous levels Kidney and liver function tests: Normal liver function test today Pancreas function test (called lipase) A blood or urine test for pregnancy (women only): Urine test was negative CT abdomen pelvis: We do not see any complications from her procedure yesterday, question some airspace disease in the bases of the lungs, but not definitely pneumonia COVID and flu testing: Negative Vital signs. See below for your results today.   Medications prescribed:  Percocet (oxycodone/acetaminophen) - narcotic pain medication  DO NOT drive or perform any activities that require you to be awake and alert because this medicine can make you drowsy. BE VERY CAREFUL not to take multiple medicines containing Tylenol (also called acetaminophen). Doing so can lead to an overdose which can damage your liver and cause liver failure and possibly death.  Zofran (ondansetron) - for nausea and vomiting  Take any prescribed medications only as directed.  Home care instructions:  Follow any educational materials contained in this packet.  Follow-up instructions: Please follow-up with your primary care provider in the next 2 days for further evaluation of your symptoms.    Return instructions:  SEEK IMMEDIATE MEDICAL ATTENTION IF: The pain does not go away or becomes severe  A temperature above 101F develops  Repeated vomiting occurs (multiple episodes)  The pain becomes localized to portions of the abdomen. The right side could possibly be appendicitis. In an adult, the left lower portion of the abdomen could be colitis or diverticulitis.  Blood is being passed in stools or vomit (bright red or black tarry stools)  You develop chest  pain, difficulty breathing, dizziness or fainting, or become confused, poorly responsive, or inconsolable (young children) If you have any other emergent concerns regarding your health  Additional Information: Abdominal (belly) pain can be caused by many things. Your caregiver performed an examination and possibly ordered blood/urine tests and imaging (CT scan, x-rays, ultrasound). Many cases can be observed and treated at home after initial evaluation in the emergency department. Even though you are being discharged home, abdominal pain can be unpredictable. Therefore, you need a repeated exam if your pain does not resolve, returns, or worsens. Most patients with abdominal pain don't have to be admitted to the hospital or have surgery, but serious problems like appendicitis and gallbladder attacks can start out as nonspecific pain. Many abdominal conditions cannot be diagnosed in one visit, so follow-up evaluations are very important.  Your vital signs today were: BP (!) 103/58   Pulse 75   Temp (!) 100.6 F (38.1 C)   Resp 20   Ht 5\' 5"  (1.651 m)   Wt 96.2 kg   LMP  (LMP Unknown)   SpO2 97%   BMI 35.28 kg/m  If your blood pressure (bp) was elevated above 135/85 this visit, please have this repeated by your doctor within one month. --------------

## 2022-01-29 NOTE — ED Provider Notes (Signed)
[facility] EMERGENCY DEPARTMENT Provider Note   CSN: [id number] Arrival date & time: 01/29/22  0458     History  Chief Complaint  Patient presents with   Chest Pain    Connie Foster is a 41 y.o. female.  Patient with history of lupus, not currently on prednisone or other immunocompromising medications --presents to the emergency department today for evaluation of chest and abdominal pain.  Patient is currently being evaluated for autoimmune hepatitis.  She had a liver biopsy done yesterday.  She states that she stayed after the procedure for about 2 hours and felt well.  Approximately 2 hours after leaving however, she developed pain in her upper abdomen and left lower chest underneath her breast.  She has associated shortness of breath and pain with deep breathing.  No fevers.  She has had nausea but no vomiting.  No bleeding or other complications.  She does have a headache that she reports is atypical. Patient denies signs of stroke including: facial droop, slurred speech, aphasia, weakness/numbness in extremities, imbalance/trouble walking.       Home Medications Prior to Admission medications   Medication Sig Start Date End Date Taking? Authorizing Provider  acetaminophen (TYLENOL) 500 MG tablet Take 1,000 mg by mouth every 6 (six) hours as needed for moderate pain or headache.    [provider]  levonorgestrel (MIRENA, 52 MG,) 20 MCG/24HR IUD Mirena 20 mcg/24 hours (6 yrs) 52 mg intrauterine device  Take 1 device by intrauterine route.    [provider]      Allergies    Lisinopril    Review of Systems   Review of Systems  Physical Exam Updated Vital Signs BP 121/85 (BP Location: Right Arm)   Pulse 96   Temp 98.7 F (37.1 C) (Oral)   Resp 18   Ht 5\' 5"  (1.651 m)   Wt 96.2 kg   LMP  (LMP Unknown)   SpO2 94%   BMI 35.28 kg/m   Physical Exam Vitals and nursing note reviewed.  Constitutional:      General: She is not  in acute distress.    Appearance: She is well-developed. She is not diaphoretic.  HENT:     Head: Normocephalic and atraumatic.     Right Ear: External ear normal.     Left Ear: External ear normal.     Nose: Nose normal.     Mouth/Throat:     Mouth: Mucous membranes are not dry.  Eyes:     Conjunctiva/sclera: Conjunctivae normal.  Neck:     Vascular: Normal carotid pulses. No JVD.     Trachea: Trachea normal. No tracheal deviation.  Cardiovascular:     Rate and Rhythm: Normal rate and regular rhythm.     Pulses: No decreased pulses.          Radial pulses are 2+ on the right side and 2+ on the left side.     Heart sounds: Normal heart sounds, S1 normal and S2 normal. No murmur heard. Pulmonary:     Effort: Pulmonary effort is normal. No respiratory distress.     Breath sounds: No wheezing, rhonchi or rales.  Chest:     Chest wall: No tenderness.  Abdominal:     General: Bowel sounds are normal.     Palpations: Abdomen is soft.     Tenderness: There is abdominal tenderness. There is no guarding or rebound.     Comments: Patient has tenderness across the upper abdomen bilaterally.  No drainage from puncture wound site.  Musculoskeletal:        General: Normal range of motion.     Cervical back: Normal range of motion and neck supple. No muscular tenderness.     Right lower leg: No edema.     Left lower leg: No edema.  Skin:    General: Skin is warm and dry.     Coloration: Skin is not pale.     Findings: No rash.  Neurological:     General: No focal deficit present.     Mental Status: She is alert. Mental status is at baseline.     Motor: No weakness.  Psychiatric:        Mood and Affect: Mood normal.     ED Results / Procedures / Treatments   Labs (all labs ordered are listed, but only abnormal results are displayed) Labs Reviewed  CBC WITH DIFFERENTIAL/PLATELET - Abnormal; Notable for the following components:      Result Value   RBC 3.45 (*)    Hemoglobin 8.6  (*)    HCT 27.1 (*)    MCV 78.6 (*)    MCH 24.9 (*)    All other components within normal limits  COMPREHENSIVE METABOLIC PANEL - Abnormal; Notable for the following components:   Potassium 3.2 (*)    CO2 19 (*)    Glucose, Bld 101 (*)    Creatinine, Ser 1.09 (*)    Calcium 8.7 (*)    Total Protein 9.4 (*)    Albumin 3.3 (*)    All other components within normal limits  I-STAT BETA HCG BLOOD, ED ([facility], [facility], [facility] ONLY) - Abnormal; Notable for the following components:   I-stat hCG, quantitative 18.9 (*)    All other components within normal limits  RESP PANEL BY RT-PCR (FLU A&B, COVID) ARPGX2  POC URINE PREG, ED  TROPONIN I (HIGH SENSITIVITY)  TROPONIN I (HIGH SENSITIVITY)    EKG None  Radiology DG Chest 2 View  Result Date: 01/29/2022 CLINICAL DATA:  Chest pain.  Status post liver biopsy. EXAM: CHEST - 2 VIEW COMPARISON:  03/12/2015 FINDINGS: The heart size and mediastinal contours are within normal limits. Both lungs are clear. The visualized skeletal structures are unremarkable. IMPRESSION: No active cardiopulmonary disease. Electronically Signed   By: Kennith Center M.D.   On: 01/29/2022 05:48   US BIOPSY (LIVER)  Result Date: 01/28/2022 INDICATION: 41 year old woman with elevated liver function tests, concerning for autoimmune hepatitis presents to IR for non targeted liver biopsy EXAM: Ultrasound-guided random liver biopsy MEDICATIONS: None. ANESTHESIA/SEDATION: Moderate (conscious) sedation was employed during this procedure. A total of Versed 2 mg and Fentanyl 100 mcg was administered intravenously by the radiology nurse. Total intra-service moderate Sedation Time: 12 minutes. The patient's level of consciousness and vital signs were monitored continuously by radiology nursing throughout the procedure under my direct supervision. COMPLICATIONS: None immediate. PROCEDURE: Informed written consent was obtained from the patient after a thorough discussion of the procedural risks,  benefits and alternatives. All questions were addressed. Maximal Sterile Barrier Technique was utilized including caps, mask, sterile gowns, sterile gloves, sterile drape, hand hygiene and skin antiseptic. A timeout was performed prior to the initiation of the procedure. Patient position supine on the ultrasound table. Epigastric skin prepped and draped in usual sterile fashion. Following local lidocaine administration, 17 gauge introducer needle was advanced into the left hepatic lobe, and three 18 gauge cores were obtained utilizing continuous ultrasound guidance. Gelfoam slurry was administered through  the introducer needle at the biopsy site. Samples were sent to pathology in formalin. Needle removed and hemostasis achieved with 5 minutes of manual compression. Post procedure ultrasound images showed no evidence of significant hemorrhage. IMPRESSION: Ultrasound-guided random liver biopsy. Electronically Signed   By: Acquanetta Belling M.D.   On: 01/28/2022 13:35    Procedures Procedures    Medications Ordered in ED Medications  HYDROmorphone (DILAUDID) injection 0.5 mg (0.5 mg Intravenous Given 01/29/22 0910)  ondansetron (ZOFRAN) injection 4 mg (4 mg Intravenous Given 01/29/22 0909)  sodium chloride 0.9 % bolus 500 mL (0 mLs Intravenous Stopped 01/29/22 1007)  acetaminophen (TYLENOL) tablet 650 mg (650 mg Oral Given 01/29/22 1350)  iohexol (OMNIPAQUE) 350 MG/ML injection 70 mL (70 mLs Intravenous Contrast Given 01/29/22 1340)    ED Course/ Medical Decision Making/ A&P    Patient seen and examined. History obtained directly from patient. Work-up including labs, imaging, EKG ordered in triage, if performed, were reviewed.  Reviewed external rheumatology notes.  Labs/EKG: Independently reviewed and interpreted.  This included: CBC with normal white blood cell count at 7.2, hemoglobin 8.6 which is close to the patient's baseline chronic anemia; CMP mild hypokalemia 3.2, glucose 101, creatinine 1.09;  first troponin 5; beta i-STAT hCG mildly elevated at 18.9 likely false positive will send urine pregnancy.  Imaging: Independently visualized and interpreted.  This included: Chest x-ray, agree negative  Ordered CT abdomen pelvis with contrast  Medications/Fluids: Ordered: 0.5 mg IV Dilaudid, 4 mg IV Zofran, 500 cc fluid bolus normal saline.   Most recent vital signs reviewed and are as follows: BP 121/85 (BP Location: Right Arm)   Pulse 96   Temp 98.7 F (37.1 C) (Oral)   Resp 18   Ht 5\' 5"  (1.651 m)   Wt 96.2 kg   LMP  (LMP Unknown)   SpO2 94%   BMI 35.28 kg/m   Initial impression: Abdominal chest pain, starting several hours after liver biopsy. Given persistent pain, tenderness on exam, will obtain imaging to evaluate for any hematoma or other bleeding, perforation.  1:42 PM Fever noted. Delay in obtaining CT imaging 2/2 getting result of urine pregnancy, which is negative. Tylenol ordered for fever.   3:36 PM Reassessment performed. Patient appears well.  Minimal upper abdominal tenderness on exam and she has not developed any rebound or guarding.  We reviewed all of her lab work and CT findings at bedside.  (Of note, I had to go into the imaging as initial results did not crossover).   On my recheck, temperature 99.9 F.  Labs personally reviewed and interpreted including: Urine pregnancy was negative  Imaging personally visualized and interpreted including: CT imaging of the abdomen pelvis, agree no acute findings.  I did tell the patient about her hiatal hernia which she was not aware of.  Reviewed pertinent lab work and imaging with patient at bedside. Questions answered.   Most current vital signs reviewed and are as follows: BP (!) 103/58   Pulse 75   Temp (!) 100.6 F (38.1 C)   Resp 20   Ht 5\' 5"  (1.651 m)   Wt 96.2 kg   LMP  (LMP Unknown)   SpO2 97%   BMI 35.28 kg/m   Plan: Discharge to home with symptomatic control at this time.  Prescription Percocet x5  tablets, Zofran.  Encouraged use of NSAIDs as well for fever.  Other home care instructions discussed: Closely monitor symptoms  ED return instructions discussed: The patient was urged to return  to the Emergency Department immediately with worsening of current symptoms, worsening abdominal pain, persistent vomiting, blood noted in stools, fever, or any other concerns. The patient verbalized understanding.   Follow-up instructions discussed: Patient encouraged to follow-up with their PCP in 2 days.                           Medical Decision Making Amount and/or Complexity of Data Reviewed Radiology: ordered.  Risk OTC drugs. Prescription drug management.   Patient with upper abdominal pain today after having liver biopsy done yesterday.  Overall labs and imaging reassuring.  She did develop a fever during ED stay.  Flu and COVID were negative.  She is not coughing or in any respiratory distress and have low concern for pneumonia at this time.  For this patient's complaint of abdominal pain, the following conditions were considered on the differential diagnosis: gastritis/PUD, enteritis/duodenitis, appendicitis, cholelithiasis/cholecystitis, cholangitis, pancreatitis, ruptured viscus, colitis, diverticulitis, small/large bowel obstruction, proctitis, cystitis, pyelonephritis, ureteral colic, aortic dissection, aortic aneurysm. In women, ectopic pregnancy, pelvic inflammatory disease, ovarian cysts, and tubo-ovarian abscess were also considered. Atypical chest etiologies were also considered including ACS, PE, and pneumonia.   The patient's vital signs, pertinent lab work and imaging were reviewed and interpreted as discussed in the ED course. Hospitalization was considered for further testing, treatments, or serial exams/observation. However as patient is well-appearing, has a stable exam, and reassuring studies today, I do not feel that they warrant admission at this time. This plan was  discussed with the patient who verbalizes agreement and comfort with this plan and seems reliable and able to return to the Emergency Department with worsening or changing symptoms.          Final Clinical Impression(s) / ED Diagnoses Final diagnoses:  Upper abdominal pain  Fever, unspecified fever cause    Rx / DC Orders ED Discharge Orders          Ordered    oxyCODONE-acetaminophen (PERCOCET/ROXICET) 5-325 MG tablet  Every 6 hours PRN        01/29/22 1532    ondansetron (ZOFRAN-ODT) 4 MG disintegrating tablet  Every 8 hours PRN        01/29/22 1532              Renne Crigler, PA-C 01/29/22 1542    Gwyneth Sprout, MD 02/01/22 1035

## 2022-01-29 NOTE — ED Notes (Signed)
Pt in room resting. NAD chest rising and falling. Visitor at bedside.

## 2022-01-29 NOTE — ED Triage Notes (Signed)
Pt arrived POV from home c/o left sided CP that radiates to her left shoulder. Pt states she had a liver biopsy done yesterday and now she hurts all over and has a headache.

## 2022-02-01 LAB — SURGICAL PATHOLOGY

## 2022-02-04 ENCOUNTER — Telehealth: Payer: Self-pay

## 2022-02-04 NOTE — Telephone Encounter (Signed)
-----   Message from Meredith Pel, NP sent at 02/04/2022 12:50 PM EST -----  Vernona Rieger, will you please contact Annamarie Major, NP with [facility] and see if this patient has a follow up with her?  Please let her nurse know that the liver biopsy results are in Epic. We ordered the liver biopsy prior to her seeing Dawn.  Thanks

## 2022-02-04 NOTE — Telephone Encounter (Signed)
Called office and spoke with front office staff who stated that pt was seen on 01/26/22 and has a follow up scheduled for 04/22/22. Left message for nurse that liver biopsy results are in epic.

## 2022-02-05 ENCOUNTER — Encounter: Payer: Self-pay | Admitting: Internal Medicine

## 2022-02-05 DIAGNOSIS — M329 Systemic lupus erythematosus, unspecified: Secondary | ICD-10-CM

## 2022-02-05 MED ORDER — HYDROXYCHLOROQUINE SULFATE 200 MG PO TABS: 400.0000 mg | ORAL_TABLET | Freq: Every day | ORAL | 2 refills | Status: DC

## 2022-04-28 ENCOUNTER — Encounter: Payer: Self-pay | Admitting: Internal Medicine

## 2022-04-28 ENCOUNTER — Ambulatory Visit: Payer: No Typology Code available for payment source | Attending: Internal Medicine | Admitting: Internal Medicine

## 2022-04-28 VITALS — BP 117/82 | HR 71 | Resp 16 | Ht 65.0 in | Wt 219.0 lb

## 2022-04-28 DIAGNOSIS — R7989 Other specified abnormal findings of blood chemistry: Secondary | ICD-10-CM

## 2022-04-28 DIAGNOSIS — R002 Palpitations: Secondary | ICD-10-CM

## 2022-04-28 DIAGNOSIS — M329 Systemic lupus erythematosus, unspecified: Secondary | ICD-10-CM | POA: Diagnosis not present

## 2022-04-28 DIAGNOSIS — D649 Anemia, unspecified: Secondary | ICD-10-CM

## 2022-04-28 DIAGNOSIS — R911 Solitary pulmonary nodule: Secondary | ICD-10-CM | POA: Diagnosis not present

## 2022-04-28 DIAGNOSIS — E876 Hypokalemia: Secondary | ICD-10-CM | POA: Diagnosis not present

## 2022-04-28 MED ORDER — HYDROXYCHLOROQUINE SULFATE 200 MG PO TABS: 400.0000 mg | ORAL_TABLET | Freq: Every day | ORAL | 0 refills | Status: DC

## 2022-04-28 NOTE — Progress Notes (Signed)
Office Visit Note  Patient: Connie Foster             Date of Birth: 23-Apr-1980           MRN: [id number]             PCP: Camillia Herter, NP Referring: Camillia Herter, NP Visit Date: 04/28/2022   Subjective:  Follow-up   History of Present Illness: Connie Foster is a 42 y.o. female here for follow up for lupus now on hydroxychloroquine 400 mg daily.  Since her last visit she had the liver biopsy obtained and findings were benign with no inflammatory changes identified.  She subsequently started on hydroxychloroquine initially at 200 and then at 400 she has noticed an improvement with fatigue and urinary frequency, partial improvement with stiffness.  She did not notice any side effects with starting the medicine though for the past few weeks has had decreased appetite and for the past 1 week is experiencing frequent heart palpitations.  These last for only a few seconds at a time but are pretty consistently occurring every day. She does not recall similar symptoms in the past. Not associated with any chest pain, dizziness, lightheadedness, shortness of breath, or leg swelling.  Previous HPI 01/21/22 Connie Foster is a 42 y.o. female here for follow up for history of lupus with recent onset of more symptoms and also getting evaluation for possible autoimmune hepatitis.  Laboratory testing at our visit was negative for lupus specific antibody markers but did have elevated sedimentation rate of 108.  Also had IgG serum level checked after that which was over 4500.  Overall findings were concerning and has been scheduled for planned liver biopsy next week.  Her symptoms remain basically the same without any change in the past few weeks.   Previous HPI 12/25/2021 Connie Foster is a 42 y.o. female here for history of lupus.  This original diagnosis was 14 years ago on the basis of abnormal lab findings but did not have signs or symptomatic complaints at the time so was never started  on any treatment.  She was not seeing a rheumatologist at the time.  More recently had some repeat work-up being evaluated due to feeling worse with new symptoms since about 4 months ago.  This includes evaluation in gastroenterology clinic with elevated liver enzymes which are new.  She has new problems with headache and vertigo symptoms.  Left hip pain is a more chronic problem but has been increased more than usual.  She is experiencing constipation usually without severe abdominal pain or other GI issues.  She does describe a sensation of dry mouth and increasing urinary frequency compared to her usual.  Overall exertion tolerance is worse than before.  For the past 1 month symptoms have been bad enough that she is stopped doing any routine exercise she previously had been very regular with multiple times per week aerobic exercises.  She had a skin rash on the shins recently that was treated with topical prescription medication and resolves before now.  She denies any issues with oral or nasal ulcers, lymphadenopathy, Raynaud's symptoms, or photosensitive skin rashes.   Labs reviewed 12/2021 ANA 1:1280 fine speckled Smooth muscle Ab neg Mitochondrial Ab neg Ttg IgA neg   10/2021 CBC WBC 3.8 Hgb 9.6 AST 100 ALT 98 SPEP polyclonal gamma globulin increase HAV Ab pos HBV sAb pos HBV sAg neg   Review of Systems  Constitutional:  Positive for fatigue.  HENT:  Positive  for mouth dryness. Negative for mouth sores.   Eyes:  Negative for dryness.  Respiratory:  Negative for shortness of breath.   Cardiovascular:  Positive for palpitations. Negative for chest pain.  Gastrointestinal:  Negative for blood in stool, constipation and diarrhea.  Endocrine: Positive for increased urination.  Genitourinary:  Negative for involuntary urination.  Musculoskeletal:  Positive for morning stiffness. Negative for joint pain, gait problem, joint pain, joint swelling, myalgias, muscle weakness, muscle tenderness  and myalgias.  Skin:  Negative for color change, rash, hair loss and sensitivity to sunlight.  Allergic/Immunologic: Negative for susceptible to infections.  Neurological:  Negative for dizziness and headaches.  Hematological:  Negative for swollen glands.  Psychiatric/Behavioral:  Negative for depressed mood and sleep disturbance. The patient is nervous/anxious.     PMFS History:  Patient Active Problem List   Diagnosis Date Noted   Anemia 04/28/2022   Personal history of systemic lupus erythematosus (SLE) ([place]) 12/25/2021   Vertigo 12/25/2021   Pain in left hip 12/25/2021   Liver function test abnormality 12/25/2021   Urinary frequency 12/25/2021   Lung nodule    Dyspnea 03/12/2015   Hypokalemia 03/12/2015   Reactive airway disease with wheezing with acute exacerbation 03/12/2015    Past Medical History:  Diagnosis Date   History of hypertension    Hypokalemia    Systemic lupus erythematosus ([place])     Family History  Problem Relation Age of Onset   Hypertension Mother    Allergies Mother    Diabetes Mother    Healthy Father    Thyroid disease Brother    Colon cancer Maternal Uncle    Breast cancer Maternal Grandmother    Healthy Son    Esophageal cancer Neg Hx    Stomach cancer Neg Hx    Rectal cancer Neg Hx    Past Surgical History:  Procedure Laterality Date   CESAREAN SECTION     SLEEVE GASTROPLASTY     TONSILLECTOMY     Social History   Social History Narrative   Not on file   Immunization History  Administered Date(s) Administered   Influenza,inj,Quad PF,6+ Mos 12/19/2018   Influenza-Unspecified 12/22/2020   Moderna SARS-COV2 Booster Vaccination 12/24/2020   Moderna Sars-Covid-2 Vaccination 06/10/2019, 07/08/2019, 02/24/2020   Pfizer Covid-19 Vaccine Bivalent Booster 39yrs & up 12/22/2020   Tdap 12/19/2018     Objective: Vital Signs: BP 117/82 (BP Location: Left Arm, Patient Position: Sitting, Cuff Size: Large)   Pulse 71   Resp 16   Ht 5\' 5"   (1.651 m)   Wt 219 lb (99.3 kg)   BMI 36.44 kg/m    Physical Exam Constitutional:      Appearance: She is obese.  HENT:     Mouth/Throat:     Mouth: Mucous membranes are moist.     Pharynx: Oropharynx is clear.  Eyes:     Conjunctiva/sclera: Conjunctivae normal.  Cardiovascular:     Rate and Rhythm: Normal rate and regular rhythm.  Pulmonary:     Effort: Pulmonary effort is normal.     Breath sounds: Normal breath sounds.  Musculoskeletal:     Right lower leg: No edema.     Left lower leg: No edema.  Lymphadenopathy:     Cervical: No cervical adenopathy.  Skin:    General: Skin is warm and dry.     Findings: No rash.  Neurological:     Mental Status: She is alert.  Psychiatric:        Mood and  Affect: Mood normal.      Musculoskeletal Exam:  Shoulders full ROM no tenderness or swelling Elbows full ROM no tenderness or swelling Wrists full ROM no tenderness or swelling Fingers full ROM no tenderness or swelling Knees full ROM no tenderness or swelling   Investigation: No additional findings.  Imaging: No results found.  Recent Labs: Lab Results  Component Value Date   WBC 7.2 01/29/2022   HGB 8.6 (L) 01/29/2022   PLT 289 01/29/2022   NA 135 01/29/2022   K 3.2 (L) 01/29/2022   CL 110 01/29/2022   CO2 19 (L) 01/29/2022   GLUCOSE 101 (H) 01/29/2022   BUN 14 01/29/2022   CREATININE 1.09 (H) 01/29/2022   BILITOT 0.7 01/29/2022   ALKPHOS 61 01/29/2022   AST 35 01/29/2022   ALT 21 01/29/2022   PROT 9.4 (H) 01/29/2022   ALBUMIN 3.3 (L) 01/29/2022   CALCIUM 8.7 (L) 01/29/2022   GFRAA >60 03/13/2015    Speciality Comments: PLQ EYE EXAM 03/29/2022 Normal [facility] f/u 5 years  Procedures:  No procedures performed Allergies: Lisinopril   Assessment / Plan:     Visit Diagnoses: Personal history of systemic lupus erythematosus (SLE) ([place]) - Plan: hydroxychloroquine (PLAQUENIL) 200 MG tablet, Sedimentation rate, CBC with Differential/Platelet,  COMPLETE METABOLIC PANEL WITH GFR  Symptoms and labs remain mostly nonspecific but with improvement on hydroxychloroquine treatment history of lupus and her labs overall I think this probably is consistent with systemic lupus.  Will plan to continue hydroxychloroquine 400 mg daily.  Rechecking CBC and CMP for monitoring disease activity and medication.  Checking sedimentation rate to see if this has improved correlating with her symptoms.  She had eye exam in January with normal baseline recommending 5-year follow-up for hydroxychloroquine retinal toxicity monitoring.  Liver function test abnormality  Biopsy was reassuring no evidence of autoimmune hepatitis and most recent LFTs had improved in November.  Rechecking metabolic panel today.  Possibility of lupoid type hepatitis which is often nonspecific and may improve with immunomodulatory treatment.  Palpitations Anemia Hypokalemia  New symptom of heart palpitations for the past 1 week I think this is less likely related to hydroxychloroquine treatment because she has been on the medicine for almost 3 months with none of the symptoms before that.  She does have known anemia and hypokalemia on most recent labs which have been chronic and may also be a cause for the intermittent palpitations.  Rechecking these on labs today.  If no specific change to account for symptoms recommend she might try reducing hydroxychloroquine dose back to 200 mg daily to monitor if that improves anything.  If symptoms getting progressively worse or new associated symptoms would consider cardiology evaluation.  Orders: Orders Placed This Encounter  Procedures   Sedimentation rate   CBC with Differential/Platelet   COMPLETE METABOLIC PANEL WITH GFR   Meds ordered this encounter  Medications   hydroxychloroquine (PLAQUENIL) 200 MG tablet    Sig: Take 2 tablets (400 mg total) by mouth daily.    Dispense:  180 tablet    Refill:  0     Follow-Up Instructions:  Return in about 3 months (around 07/27/2022) for SLE on HCQ f/u 44mos.   Collier Salina, MD  Note - This record has been created using [employer].  Chart creation errors have been sought, but may not always  have been located. Such creation errors do not reflect on  the standard of medical care.

## 2022-04-29 LAB — COMPLETE METABOLIC PANEL WITH GFR
AG Ratio: 0.5 (calc) — ABNORMAL LOW (ref 1.0–2.5)
ALT: 27 U/L (ref 6–29)
AST: 72 U/L — ABNORMAL HIGH (ref 10–30)
Albumin: 3.7 g/dL (ref 3.6–5.1)
Alkaline phosphatase (APISO): 69 U/L (ref 31–125)
BUN: 9 mg/dL (ref 7–25)
CO2: 26 mmol/L (ref 20–32)
Calcium: 9.1 mg/dL (ref 8.6–10.2)
Chloride: 106 mmol/L (ref 98–110)
Creat: 0.89 mg/dL (ref 0.50–0.99)
Globulin: 6.9 g/dL (calc) — ABNORMAL HIGH (ref 1.9–3.7)
Glucose, Bld: 83 mg/dL (ref 65–99)
Potassium: 3.6 mmol/L (ref 3.5–5.3)
Sodium: 137 mmol/L (ref 135–146)
Total Bilirubin: 0.7 mg/dL (ref 0.2–1.2)
Total Protein: 10.6 g/dL — ABNORMAL HIGH (ref 6.1–8.1)
eGFR: 82 mL/min/{1.73_m2} (ref >=60)

## 2022-04-29 LAB — CBC WITH DIFFERENTIAL/PLATELET
Absolute Monocytes: 714 cells/uL (ref 200–950)
Basophils Absolute: 31 cells/uL (ref 0–200)
Basophils Relative: 1.1 %
Eosinophils Absolute: 81 cells/uL (ref 15–500)
Eosinophils Relative: 2.9 %
HCT: 28.5 % — ABNORMAL LOW (ref 35.0–45.0)
Hemoglobin: 9 g/dL — ABNORMAL LOW (ref 11.7–15.5)
Lymphs Abs: 1470 cells/uL (ref 850–3900)
MCH: 25 pg — ABNORMAL LOW (ref 27.0–33.0)
MCHC: 31.6 g/dL — ABNORMAL LOW (ref 32.0–36.0)
MCV: 79.2 fL — ABNORMAL LOW (ref 80.0–100.0)
MPV: 10.7 fL (ref 7.5–12.5)
Monocytes Relative: 25.5 %
Neutro Abs: 504 cells/uL — ABNORMAL LOW (ref 1500–7800)
Neutrophils Relative %: 18 %
Platelets: 293 10*3/uL (ref 140–400)
RBC: 3.6 10*6/uL — ABNORMAL LOW (ref 3.80–5.10)
RDW: 14.4 % (ref 11.0–15.0)
Total Lymphocyte: 52.5 %
WBC: 2.8 10*3/uL — ABNORMAL LOW (ref 3.8–10.8)

## 2022-04-29 LAB — SEDIMENTATION RATE: Sed Rate: 89 mm/h — ABNORMAL HIGH (ref 0–20)

## 2022-07-26 NOTE — Progress Notes (Unsigned)
Office Visit Note  Patient: Connie Foster             Date of Birth: 1980-08-11           MRN: [id number]             PCP: Rema Fendt, NP Referring: Rema Fendt, NP Visit Date: 07/27/2022   Subjective:  No chief complaint on file.   History of Present Illness: Connie Foster is a 42 y.o. female here for follow up ***   Previous HPI 04/28/22 Connie Foster is a 42 y.o. female here for follow up for lupus now on hydroxychloroquine 400 mg daily.  Since her last visit she had the liver biopsy obtained and findings were benign with no inflammatory changes identified.  She subsequently started on hydroxychloroquine initially at 200 and then at 400 she has noticed an improvement with fatigue and urinary frequency, partial improvement with stiffness.  She did not notice any side effects with starting the medicine though for the past few weeks has had decreased appetite and for the past 1 week is experiencing frequent heart palpitations.  These last for only a few seconds at a time but are pretty consistently occurring every day. She does not recall similar symptoms in the past. Not associated with any chest pain, dizziness, lightheadedness, shortness of breath, or leg swelling.   Previous HPI 01/21/22 Connie Foster is a 42 y.o. female here for follow up for history of lupus with recent onset of more symptoms and also getting evaluation for possible autoimmune hepatitis.  Laboratory testing at our visit was negative for lupus specific antibody markers but did have elevated sedimentation rate of 108.  Also had IgG serum level checked after that which was over 4500.  Overall findings were concerning and has been scheduled for planned liver biopsy next week.  Her symptoms remain basically the same without any change in the past few weeks.   Previous HPI 12/25/2021 Connie Foster is a 42 y.o. female here for history of lupus.  This original diagnosis was 14 years ago on the basis  of abnormal lab findings but did not have signs or symptomatic complaints at the time so was never started on any treatment.  She was not seeing a rheumatologist at the time.  More recently had some repeat work-up being evaluated due to feeling worse with new symptoms since about 4 months ago.  This includes evaluation in gastroenterology clinic with elevated liver enzymes which are new.  She has new problems with headache and vertigo symptoms.  Left hip pain is a more chronic problem but has been increased more than usual.  She is experiencing constipation usually without severe abdominal pain or other GI issues.  She does describe a sensation of dry mouth and increasing urinary frequency compared to her usual.  Overall exertion tolerance is worse than before.  For the past 1 month symptoms have been bad enough that she is stopped doing any routine exercise she previously had been very regular with multiple times per week aerobic exercises.  She had a skin rash on the shins recently that was treated with topical prescription medication and resolves before now.  She denies any issues with oral or nasal ulcers, lymphadenopathy, Raynaud's symptoms, or photosensitive skin rashes.   Labs reviewed 12/2021 ANA 1:1280 fine speckled Smooth muscle Ab neg Mitochondrial Ab neg Ttg IgA neg   10/2021 CBC WBC 3.8 Hgb 9.6 AST 100 ALT 98 SPEP polyclonal gamma globulin increase HAV Ab  pos HBV sAb pos HBV sAg neg   No Rheumatology ROS completed.   PMFS History:  Patient Active Problem List   Diagnosis Date Noted   Anemia 04/28/2022   Palpitations 04/28/2022   Personal history of systemic lupus erythematosus (SLE) ([facility]) 12/25/2021   Vertigo 12/25/2021   Pain in left hip 12/25/2021   Liver function test abnormality 12/25/2021   Urinary frequency 12/25/2021   Lung nodule    Dyspnea 03/12/2015   Hypokalemia 03/12/2015   Reactive airway disease with wheezing with acute exacerbation 03/12/2015    Past  Medical History:  Diagnosis Date   History of hypertension    Hypokalemia    Systemic lupus erythematosus ([facility])     Family History  Problem Relation Age of Onset   Hypertension Mother    Allergies Mother    Diabetes Mother    Healthy Father    Thyroid disease Brother    Colon cancer Maternal Uncle    Breast cancer Maternal Grandmother    Healthy Son    Esophageal cancer Neg Hx    Stomach cancer Neg Hx    Rectal cancer Neg Hx    Past Surgical History:  Procedure Laterality Date   CESAREAN SECTION     SLEEVE GASTROPLASTY     TONSILLECTOMY     Social History   Social History Narrative   Not on file   Immunization History  Administered Date(s) Administered   Influenza,inj,Quad PF,6+ Mos 12/19/2018   Influenza-Unspecified 12/22/2020   Moderna SARS-COV2 Booster Vaccination 12/24/2020   Moderna Sars-Covid-2 Vaccination 06/10/2019, 07/08/2019, 02/24/2020   Pfizer Covid-19 Vaccine Bivalent Booster 66yrs & up 12/22/2020   Tdap 12/19/2018     Objective: Vital Signs: There were no vitals taken for this visit.   Physical Exam   Musculoskeletal Exam: ***  CDAI Exam: CDAI Score: -- Patient Global: --; Provider Global: -- Swollen: --; Tender: -- Joint Exam 07/27/2022   No joint exam has been documented for this visit   There is currently no information documented on the homunculus. Go to the Rheumatology activity and complete the homunculus joint exam.  Investigation: No additional findings.  Imaging: No results found.  Recent Labs: Lab Results  Component Value Date   WBC 2.8 (L) 04/28/2022   HGB 9.0 (L) 04/28/2022   PLT 293 04/28/2022   NA 137 04/28/2022   K 3.6 04/28/2022   CL 106 04/28/2022   CO2 26 04/28/2022   GLUCOSE 83 04/28/2022   BUN 9 04/28/2022   CREATININE 0.89 04/28/2022   BILITOT 0.7 04/28/2022   ALKPHOS 61 01/29/2022   AST 72 (H) 04/28/2022   ALT 27 04/28/2022   PROT 10.6 (H) 04/28/2022   ALBUMIN 3.3 (L) 01/29/2022   CALCIUM 9.1  04/28/2022   GFRAA >60 03/13/2015    Speciality Comments: PLQ EYE EXAM 03/29/2022 Normal [facility] f/u 5 years  Procedures:  No procedures performed Allergies: Lisinopril   Assessment / Plan:     Visit Diagnoses: No diagnosis found.  ***  Orders: No orders of the defined types were placed in this encounter.  No orders of the defined types were placed in this encounter.    Follow-Up Instructions: No follow-ups on file.   Fuller Plan, MD  Note - This record has been created using [employer].  Chart creation errors have been sought, but may not always  have been located. Such creation errors do not reflect on  the standard of medical care.

## 2022-07-27 ENCOUNTER — Encounter: Payer: Self-pay | Admitting: Internal Medicine

## 2022-07-27 ENCOUNTER — Ambulatory Visit: Payer: Managed Care, Other (non HMO) | Attending: Internal Medicine | Admitting: Internal Medicine

## 2022-07-27 VITALS — BP 136/80 | HR 74 | Resp 16 | Ht 65.0 in | Wt 216.0 lb

## 2022-07-27 DIAGNOSIS — M329 Systemic lupus erythematosus, unspecified: Secondary | ICD-10-CM

## 2022-07-27 DIAGNOSIS — M350C Sjogren syndrome with dental involvement: Secondary | ICD-10-CM | POA: Diagnosis not present

## 2022-07-27 DIAGNOSIS — R7989 Other specified abnormal findings of blood chemistry: Secondary | ICD-10-CM | POA: Diagnosis not present

## 2022-07-27 MED ORDER — HYDROXYCHLOROQUINE SULFATE 200 MG PO TABS: 400.0000 mg | ORAL_TABLET | Freq: Every day | ORAL | 1 refills | Status: DC

## 2022-07-28 LAB — CBC WITH DIFFERENTIAL/PLATELET
Absolute Monocytes: 744 cells/uL (ref 200–950)
Basophils Absolute: 22 cells/uL (ref 0–200)
Basophils Relative: 0.5 %
Eosinophils Absolute: 60 cells/uL (ref 15–500)
Eosinophils Relative: 1.4 %
HCT: 31.1 % — ABNORMAL LOW (ref 35.0–45.0)
Hemoglobin: 9.6 g/dL — ABNORMAL LOW (ref 11.7–15.5)
Lymphs Abs: 1458 cells/uL (ref 850–3900)
MCH: 24.6 pg — ABNORMAL LOW (ref 27.0–33.0)
MCHC: 30.9 g/dL — ABNORMAL LOW (ref 32.0–36.0)
MCV: 79.5 fL — ABNORMAL LOW (ref 80.0–100.0)
MPV: 10.7 fL (ref 7.5–12.5)
Monocytes Relative: 17.3 %
Neutro Abs: 2017 cells/uL (ref 1500–7800)
Neutrophils Relative %: 46.9 %
Platelets: 284 10*3/uL (ref 140–400)
RBC: 3.91 10*6/uL (ref 3.80–5.10)
RDW: 14.1 % (ref 11.0–15.0)
Total Lymphocyte: 33.9 %
WBC: 4.3 10*3/uL (ref 3.8–10.8)

## 2022-07-28 LAB — COMPLETE METABOLIC PANEL WITH GFR
AG Ratio: 0.7 (calc) — ABNORMAL LOW (ref 1.0–2.5)
ALT: 11 U/L (ref 6–29)
AST: 23 U/L (ref 10–30)
Albumin: 3.9 g/dL (ref 3.6–5.1)
Alkaline phosphatase (APISO): 69 U/L (ref 31–125)
BUN: 20 mg/dL (ref 7–25)
CO2: 22 mmol/L (ref 20–32)
Calcium: 9.3 mg/dL (ref 8.6–10.2)
Chloride: 105 mmol/L (ref 98–110)
Creat: 0.93 mg/dL (ref 0.50–0.99)
Globulin: 5.8 g/dL (calc) — ABNORMAL HIGH (ref 1.9–3.7)
Glucose, Bld: 73 mg/dL (ref 65–99)
Potassium: 3.8 mmol/L (ref 3.5–5.3)
Sodium: 136 mmol/L (ref 135–146)
Total Bilirubin: 0.6 mg/dL (ref 0.2–1.2)
Total Protein: 9.7 g/dL — ABNORMAL HIGH (ref 6.1–8.1)
eGFR: 78 mL/min/{1.73_m2} (ref >=60)

## 2022-07-28 LAB — SEDIMENTATION RATE: Sed Rate: 79 mm/h — ABNORMAL HIGH (ref 0–20)

## 2022-07-28 LAB — C3 AND C4
C3 Complement: 111 mg/dL (ref 83–193)
C4 Complement: 17 mg/dL (ref 15–57)

## 2022-07-28 NOTE — Progress Notes (Signed)
Sedimentation rate is partially improved again at 79 down from 89 although this is still high. The white blood cell count improved back to normal so this is definitely not related to the plaquenil. Anemia is also improving hemoglobin up to 9.6 from 9. Overall looks like inflammation is still active but is improving, okay to continue current treatment.

## 2022-07-29 ENCOUNTER — Other Ambulatory Visit: Payer: Self-pay | Admitting: Obstetrics

## 2022-07-29 DIAGNOSIS — N6320 Unspecified lump in the left breast, unspecified quadrant: Secondary | ICD-10-CM

## 2022-08-12 ENCOUNTER — Ambulatory Visit
Admission: RE | Admit: 2022-08-12 | Discharge: 2022-08-12 | Disposition: A | Discharge status: Home / Self Care | Payer: Managed Care, Other (non HMO) | Source: Ambulatory Visit | Attending: Obstetrics | Admitting: Obstetrics

## 2022-08-12 DIAGNOSIS — N6320 Unspecified lump in the left breast, unspecified quadrant: Secondary | ICD-10-CM

## 2022-10-28 ENCOUNTER — Encounter: Payer: Self-pay | Admitting: Internal Medicine

## 2023-01-13 NOTE — Progress Notes (Deleted)
Office Visit Note  Patient: Connie Foster             Date of Birth: [birth year]           MRN: [id number]             PCP: Rema Fendt, NP Referring: Rema Fendt, NP Visit Date: 01/27/2023   Subjective:  No chief complaint on file.   History of Present Illness: Connie Foster is a 42 y.o. female here for follow up for history of systemic lupus with fatigue, joint pain, abnormal liver function, and leukopenia now on hydroxychloroquine 400 mg daily.    Previous HPI 07/27/2022 Connie Foster is a 42 y.o. female here for follow up for history of systemic lupus with fatigue, joint pain, abnormal liver function, and leukopenia now on hydroxychloroquine 400 mg daily.  Overall she feels pretty well still has dry eyes and mouth, fatigue, and joint stiffness but symptoms are manageable.  She is using oral lozenges or gum and staying well-hydrated for dry mouth.  Not requiring regular use of OTC anti-inflammatories for joint pain.  She has not experienced any interval infections or complications. Continues to have tenderness and swelling around the neck and base of the jaw.   Previous HPI 04/28/22 Connie Foster is a 42 y.o. female here for follow up for lupus now on hydroxychloroquine 400 mg daily.  Since her last visit she had the liver biopsy obtained and findings were benign with no inflammatory changes identified.  She subsequently started on hydroxychloroquine initially at 200 and then at 400 she has noticed an improvement with fatigue and urinary frequency, partial improvement with stiffness.  She did not notice any side effects with starting the medicine though for the past few weeks has had decreased appetite and for the past 1 week is experiencing frequent heart palpitations.  These last for only a few seconds at a time but are pretty consistently occurring every day. She does not recall similar symptoms in the past. Not associated with any chest pain, dizziness,  lightheadedness, shortness of breath, or leg swelling.   Previous HPI 01/21/22 Connie Foster is a 42 y.o. female here for follow up for history of lupus with recent onset of more symptoms and also getting evaluation for possible autoimmune hepatitis.  Laboratory testing at our visit was negative for lupus specific antibody markers but did have elevated sedimentation rate of 108.  Also had IgG serum level checked after that which was over 4500.  Overall findings were concerning and has been scheduled for planned liver biopsy next week.  Her symptoms remain basically the same without any change in the past few weeks.   Previous HPI 12/25/2021 Connie Foster is a 42 y.o. female here for history of lupus.  This original diagnosis was 14 years ago on the basis of abnormal lab findings but did not have signs or symptomatic complaints at the time so was never started on any treatment.  She was not seeing a rheumatologist at the time.  More recently had some repeat work-up being evaluated due to feeling worse with new symptoms since about 4 months ago.  This includes evaluation in gastroenterology clinic with elevated liver enzymes which are new.  She has new problems with headache and vertigo symptoms.  Left hip pain is a more chronic problem but has been increased more than usual.  She is experiencing constipation usually without severe abdominal pain or other GI issues.  She does describe a sensation of dry  mouth and increasing urinary frequency compared to her usual.  Overall exertion tolerance is worse than before.  For the past 1 month symptoms have been bad enough that she is stopped doing any routine exercise she previously had been very regular with multiple times per week aerobic exercises.  She had a skin rash on the shins recently that was treated with topical prescription medication and resolves before now.  She denies any issues with oral or nasal ulcers, lymphadenopathy, Raynaud's symptoms, or  photosensitive skin rashes.   Labs reviewed 12/2021 ANA 1:1280 fine speckled Smooth muscle Ab neg Mitochondrial Ab neg Ttg IgA neg   10/2021 CBC WBC 3.8 Hgb 9.6 AST 100 ALT 98 SPEP polyclonal gamma globulin increase HAV Ab pos HBV sAb pos HBV sAg neg   No Rheumatology ROS completed.   PMFS History:  Patient Active Problem List   Diagnosis Date Noted   Anemia 04/28/2022   Palpitations 04/28/2022   Personal history of systemic lupus erythematosus (SLE) (HCC) 12/25/2021   Vertigo 12/25/2021   Pain in left hip 12/25/2021   Liver function test abnormality 12/25/2021   Urinary frequency 12/25/2021   Lung nodule    Dyspnea 03/12/2015   Hypokalemia 03/12/2015   Reactive airway disease with wheezing with acute exacerbation 03/12/2015    Past Medical History:  Diagnosis Date   History of hypertension    Hypokalemia    Systemic lupus erythematosus (HCC)     Family History  Problem Relation Age of Onset   Hypertension Mother    Allergies Mother    Diabetes Mother    Healthy Father    Thyroid disease Brother    Colon cancer Maternal Uncle    Breast cancer Maternal Grandmother    Healthy Son    Esophageal cancer Neg Hx    Stomach cancer Neg Hx    Rectal cancer Neg Hx    Past Surgical History:  Procedure Laterality Date   CESAREAN SECTION     SLEEVE GASTROPLASTY     TONSILLECTOMY     Social History   Social History Narrative   Not on file   Immunization History  Administered Date(s) Administered   Influenza,inj,Quad PF,6+ Mos 12/19/2018   Influenza-Unspecified 12/22/2020   Moderna SARS-COV2 Booster Vaccination 12/24/2020   Moderna Sars-Covid-2 Vaccination 06/10/2019, 07/08/2019, 02/24/2020   Pfizer Covid-19 Vaccine Bivalent Booster 99yrs & up 12/22/2020   Tdap 12/19/2018     Objective: Vital Signs: There were no vitals taken for this visit.   Physical Exam   Musculoskeletal Exam: ***  CDAI Exam: CDAI Score: -- Patient Global: --; Provider Global:  -- Swollen: --; Tender: -- Joint Exam 01/27/2023   No joint exam has been documented for this visit   There is currently no information documented on the homunculus. Go to the Rheumatology activity and complete the homunculus joint exam.  Investigation: No additional findings.  Imaging: No results found.  Recent Labs: Lab Results  Component Value Date   WBC 4.3 07/27/2022   HGB 9.6 (L) 07/27/2022   PLT 284 07/27/2022   NA 136 07/27/2022   K 3.8 07/27/2022   CL 105 07/27/2022   CO2 22 07/27/2022   GLUCOSE 73 07/27/2022   BUN 20 07/27/2022   CREATININE 0.93 07/27/2022   BILITOT 0.6 07/27/2022   ALKPHOS 61 01/29/2022   AST 23 07/27/2022   ALT 11 07/27/2022   PROT 9.7 (H) 07/27/2022   ALBUMIN 3.3 (L) 01/29/2022   CALCIUM 9.3 07/27/2022   GFRAA >60 03/13/2015  Speciality Comments: PLQ EYE EXAM 03/29/2022 Normal [facility] f/u 5 years  Procedures:  No procedures performed Allergies: Lisinopril   Assessment / Plan:     Visit Diagnoses: No diagnosis found.  ***  Orders: No orders of the defined types were placed in this encounter.  No orders of the defined types were placed in this encounter.    Follow-Up Instructions: No follow-ups on file.   Metta Clines, RT  Note - This record has been created using [employer].  Chart creation errors have been sought, but may not always  have been located. Such creation errors do not reflect on  the standard of medical care.

## 2023-01-27 ENCOUNTER — Ambulatory Visit: Payer: Managed Care, Other (non HMO) | Admitting: Internal Medicine

## 2023-01-27 DIAGNOSIS — Z79899 Other long term (current) drug therapy: Secondary | ICD-10-CM

## 2023-01-27 DIAGNOSIS — M329 Systemic lupus erythematosus, unspecified: Secondary | ICD-10-CM

## 2023-01-27 DIAGNOSIS — R7989 Other specified abnormal findings of blood chemistry: Secondary | ICD-10-CM

## 2023-01-27 DIAGNOSIS — M350C Sjogren syndrome with dental involvement: Secondary | ICD-10-CM

## 2023-02-10 NOTE — Progress Notes (Signed)
Office Visit Note  Patient: Connie Foster             Date of Birth: [birth year]           MRN: [id number]             PCP: Rema Fendt, NP Referring: Rema Fendt, NP Visit Date: 02/23/2023   Subjective:  Follow-up (Patient states she has been getting a lot of rashes all over. )   History of Present Illness:   Discussed the use of AI scribe software for clinical note transcription with the patient, who gave verbal consent to proceed.  History of Present Illness   Connie Foster is a 42 y.o. female here for follow up for history of systemic lupus with fatigue, joint pain, abnormal liver function, and leukopenia now on hydroxychloroquine 400 mg daily.  She presents with a chief complaint of recurrent, widespread rashes that have been progressively worsening over the past month. The rashes, which are initially painful and subsequently become itchy, are located on the arms, legs, face, and ears. The patient reports that the individual spots last for approximately two weeks when treated with their son's eczema cream, but prior to this, the spots would persist for months.  The patient has been taking hydroxychloroquine for an unspecified condition, and despite this, the rashes have continued to appear. The patient denies any recent illnesses and reports that this is a new symptom. They have not experienced rashes like this before.  The patient also reports persistent soreness in an unspecified area and swollen lymph nodes. Despite these symptoms, the patient reports feeling generally well and has been able to receive their flu and COVID vaccinations without any adverse reactions.    Previous HPI 07/27/2022 Connie Foster is a 42 y.o. female here for follow up for history of systemic lupus with fatigue, joint pain, abnormal liver function, and leukopenia now on hydroxychloroquine 400 mg daily.  Overall she feels pretty well still has dry eyes and mouth, fatigue, and joint  stiffness but symptoms are manageable.  She is using oral lozenges or gum and staying well-hydrated for dry mouth.  Not requiring regular use of OTC anti-inflammatories for joint pain.  She has not experienced any interval infections or complications. Continues to have tenderness and swelling around the neck and base of the jaw.   Previous HPI 04/28/22 Connie Foster is a 42 y.o. female here for follow up for lupus now on hydroxychloroquine 400 mg daily.  Since her last visit she had the liver biopsy obtained and findings were benign with no inflammatory changes identified.  She subsequently started on hydroxychloroquine initially at 200 and then at 400 she has noticed an improvement with fatigue and urinary frequency, partial improvement with stiffness.  She did not notice any side effects with starting the medicine though for the past few weeks has had decreased appetite and for the past 1 week is experiencing frequent heart palpitations.  These last for only a few seconds at a time but are pretty consistently occurring every day. She does not recall similar symptoms in the past. Not associated with any chest pain, dizziness, lightheadedness, shortness of breath, or leg swelling.   Previous HPI 01/21/22 Connie Foster is a 42 y.o. female here for follow up for history of lupus with recent onset of more symptoms and also getting evaluation for possible autoimmune hepatitis.  Laboratory testing at our visit was negative for lupus specific antibody markers but did have elevated sedimentation rate of  108.  Also had IgG serum level checked after that which was over 4500.  Overall findings were concerning and has been scheduled for planned liver biopsy next week.  Her symptoms remain basically the same without any change in the past few weeks.   Previous HPI 12/25/2021 Connie Foster is a 41 y.o. female here for history of lupus.  This original diagnosis was 14 years ago on the basis of abnormal lab  findings but did not have signs or symptomatic complaints at the time so was never started on any treatment.  She was not seeing a rheumatologist at the time.  More recently had some repeat work-up being evaluated due to feeling worse with new symptoms since about 4 months ago.  This includes evaluation in gastroenterology clinic with elevated liver enzymes which are new.  She has new problems with headache and vertigo symptoms.  Left hip pain is a more chronic problem but has been increased more than usual.  She is experiencing constipation usually without severe abdominal pain or other GI issues.  She does describe a sensation of dry mouth and increasing urinary frequency compared to her usual.  Overall exertion tolerance is worse than before.  For the past 1 month symptoms have been bad enough that she is stopped doing any routine exercise she previously had been very regular with multiple times per week aerobic exercises.  She had a skin rash on the shins recently that was treated with topical prescription medication and resolves before now.  She denies any issues with oral or nasal ulcers, lymphadenopathy, Raynaud's symptoms, or photosensitive skin rashes.   Labs reviewed 12/2021 ANA 1:1280 fine speckled Smooth muscle Ab neg Mitochondrial Ab neg Ttg IgA neg   10/2021 CBC WBC 3.8 Hgb 9.6 AST 100 ALT 98 SPEP polyclonal gamma globulin increase HAV Ab pos HBV sAb pos HBV sAg neg   Review of Systems  Constitutional:  Positive for fatigue.  HENT:  Positive for mouth dryness. Negative for mouth sores.   Eyes:  Positive for dryness.  Respiratory:  Negative for shortness of breath.   Cardiovascular:  Positive for palpitations. Negative for chest pain.  Gastrointestinal:  Negative for blood in stool, constipation and diarrhea.  Endocrine: Negative for increased urination.  Genitourinary:  Negative for involuntary urination.  Musculoskeletal:  Positive for joint pain, joint pain, myalgias,  morning stiffness, muscle tenderness and myalgias. Negative for gait problem, joint swelling and muscle weakness.  Skin:  Positive for color change and rash. Negative for hair loss and sensitivity to sunlight.  Allergic/Immunologic: Negative for susceptible to infections.  Neurological:  Positive for dizziness and headaches.  Hematological:  Positive for swollen glands.  Psychiatric/Behavioral:  Negative for depressed mood and sleep disturbance. The patient is nervous/anxious.     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   Anemia 04/28/2022   Palpitations 04/28/2022   Systemic lupus erythematosus (HCC) 12/25/2021   Vertigo 12/25/2021   Pain in left hip 12/25/2021   Liver function test abnormality 12/25/2021   Urinary frequency 12/25/2021   Lung nodule    Dyspnea 03/12/2015   Hypokalemia 03/12/2015   Reactive airway disease with wheezing with acute exacerbation 03/12/2015    Past Medical History:  Diagnosis Date   History of hypertension    Hypokalemia    Systemic lupus erythematosus (HCC)     Family History  Problem Relation Age of Onset  Hypertension Mother    Allergies Mother    Diabetes Mother    Healthy Father    Thyroid disease Brother    Colon cancer Maternal Uncle    Breast cancer Maternal Grandmother    Healthy Son    Esophageal cancer Neg Hx    Stomach cancer Neg Hx    Rectal cancer Neg Hx    Past Surgical History:  Procedure Laterality Date   CESAREAN SECTION     SLEEVE GASTROPLASTY     TONSILLECTOMY     Social History   Social History Narrative   Not on file   Immunization History  Administered Date(s) Administered   Influenza,inj,Quad PF,6+ Mos 12/19/2018   Influenza-Unspecified 12/22/2020   Moderna SARS-COV2 Booster Vaccination 12/24/2020   Moderna Sars-Covid-2 Vaccination 06/10/2019, 07/08/2019, 02/24/2020   Pfizer Covid-19 Vaccine Bivalent  Booster 62yrs & up 12/22/2020   Tdap 12/19/2018     Objective: Vital Signs: BP 126/78 (BP Location: Left Arm, Patient Position: Sitting, Cuff Size: Normal)   Pulse 77   Resp 14   Ht 5\' 5"  (1.651 m)   Wt 214 lb (97.1 kg)   BMI 35.61 kg/m    Physical Exam Constitutional:      Appearance: She is obese.  Eyes:     Conjunctiva/sclera: Conjunctivae normal.  Cardiovascular:     Rate and Rhythm: Normal rate and regular rhythm.  Pulmonary:     Effort: Pulmonary effort is normal.     Breath sounds: Normal breath sounds.  Lymphadenopathy:     Cervical: Cervical adenopathy (tender, anterior cervical) present.  Skin:    General: Skin is warm and dry.     Findings: Rash (As shown) present.  Neurological:     Mental Status: She is alert.  Psychiatric:        Mood and Affect: Mood normal.      Musculoskeletal Exam:  Shoulders full ROM no tenderness or swelling Elbows full ROM no tenderness or swelling Wrists full ROM no tenderness or swelling Fingers full ROM no tenderness or swelling Knees full ROM no tenderness or swelling        Investigation: No additional findings.  Imaging: No results found.  Recent Labs: Lab Results  Component Value Date   WBC 4.3 07/27/2022   HGB 9.6 (L) 07/27/2022   PLT 284 07/27/2022   NA 136 07/27/2022   K 3.8 07/27/2022   CL 105 07/27/2022   CO2 22 07/27/2022   GLUCOSE 73 07/27/2022   BUN 20 07/27/2022   CREATININE 0.93 07/27/2022   BILITOT 0.6 07/27/2022   ALKPHOS 61 01/29/2022   AST 23 07/27/2022   ALT 11 07/27/2022   PROT 9.7 (H) 07/27/2022   ALBUMIN 3.3 (L) 01/29/2022   CALCIUM 9.3 07/27/2022   GFRAA >60 03/13/2015    Speciality Comments: PLQ EYE EXAM 03/29/2022 Normal [facility] f/u 5 years  Procedures:  No procedures performed Allergies: Lisinopril   Assessment / Plan:     Visit Diagnoses: Sjogren's syndrome with dental involvement (HCC)  Systemic lupus erythematosus, unspecified SLE type, unspecified  organ involvement status (HCC) - Plan: Sedimentation rate, C3 and C4, hydroxychloroquine (PLAQUENIL) 200 MG tablet, azaTHIOprine (IMURAN) 50 MG tablet  New onset of painful, burning, and itchy rashes on arms, legs, face, and ears, lasting for about two weeks each. 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 Currently on hydroxychloroquine, but inflammation markers remain high, suggesting active lupus. -Start azathioprine 50 mg daily, preferred vs methotrexate and MMF with hepatitis  involvement -Continue hydroxychloroquine 400 mg daily. -Provide high potency topical steroid for use on new rashes until resolved.  High risk medication use - hydroxychloroquine 400 mg daily. PLQ EYE EXAM 03/29/2022 Normal - Plan: COMPLETE METABOLIC PANEL WITH GFR, CBC with Differential/Platelet  Reviewed risks of AZA including cytopenias, infection, malignancy with long term immunosuppression. Previous normal TPMT reviewed from 01/2022 -Checking CBC, CMP today  Urticarial vasculitis - Plan: triamcinolone ointment (KENALOG) 0.5 %  Anemia, unspecified type - Plan: Iron, TIBC and Ferritin Panel    Orders: Orders Placed This Encounter  Procedures   Sedimentation rate   Iron, TIBC and Ferritin Panel   COMPLETE METABOLIC PANEL WITH GFR   CBC with Differential/Platelet   C3 and C4   Meds ordered this encounter  Medications   hydroxychloroquine (PLAQUENIL) 200 MG tablet    Sig: Take 2 tablets (400 mg total) by mouth daily.    Dispense:  180 tablet    Refill:  1   azaTHIOprine (IMURAN) 50 MG tablet    Sig: Take 1 tablet (50 mg total) by mouth daily.    Dispense:  30 tablet    Refill:  2   triamcinolone ointment (KENALOG) 0.5 %    Sig: Apply 1 Application topically 2 (two) times daily as needed (Rash).    Dispense:  30 g    Refill:  1     Follow-Up Instructions: Return in about 2 months (around 04/26/2023) for SLE on HCQ/AZA start f/u 2mos.   Fuller Plan,  MD  Note - This record has been created using [employer].  Chart creation errors have been sought, but may not always  have been located. Such creation errors do not reflect on  the standard of medical care.

## 2023-02-19 ENCOUNTER — Other Ambulatory Visit: Payer: Self-pay | Admitting: Internal Medicine

## 2023-02-19 DIAGNOSIS — M329 Systemic lupus erythematosus, unspecified: Secondary | ICD-10-CM

## 2023-02-21 NOTE — Telephone Encounter (Signed)
Last Fill: 07/27/2022  Eye exam: 03/29/2022    Labs: 07/27/2022 Sedimentation rate is partially improved again at 79 down from 89 although this is still high. The white blood cell count improved back to normal so this is definitely not related to the plaquenil. Anemia is also improving hemoglobin up to 9.6 from 9. Overall looks like inflammation is still active but is improving, okay to continue current treatment.  Next Visit: 02/23/2023  Last Visit: 07/27/2022  [id number] syndrome with dental involvement   Current Dose per office note 07/27/2022: hydroxychloroquine 400 mg daily   Okay to refill Plaquenil?

## 2023-02-23 ENCOUNTER — Encounter: Payer: Self-pay | Admitting: Internal Medicine

## 2023-02-23 ENCOUNTER — Ambulatory Visit: Payer: Managed Care, Other (non HMO) | Attending: Internal Medicine | Admitting: Internal Medicine

## 2023-02-23 VITALS — BP 126/78 | HR 77 | Resp 14 | Ht 65.0 in | Wt 214.0 lb

## 2023-02-23 DIAGNOSIS — L958 Other vasculitis limited to the skin: Secondary | ICD-10-CM | POA: Insufficient documentation

## 2023-02-23 DIAGNOSIS — Z79899 Other long term (current) drug therapy: Secondary | ICD-10-CM | POA: Insufficient documentation

## 2023-02-23 DIAGNOSIS — M329 Systemic lupus erythematosus, unspecified: Secondary | ICD-10-CM | POA: Diagnosis not present

## 2023-02-23 DIAGNOSIS — R7989 Other specified abnormal findings of blood chemistry: Secondary | ICD-10-CM

## 2023-02-23 DIAGNOSIS — M350C Sjogren syndrome with dental involvement: Secondary | ICD-10-CM | POA: Diagnosis not present

## 2023-02-23 DIAGNOSIS — D649 Anemia, unspecified: Secondary | ICD-10-CM

## 2023-02-23 MED ORDER — AZATHIOPRINE 50 MG PO TABS: 50.0000 mg | ORAL_TABLET | Freq: Every day | ORAL | 2 refills | Status: DC

## 2023-02-23 MED ORDER — TRIAMCINOLONE ACETONIDE 0.5 % EX OINT: 1.0000 | TOPICAL_OINTMENT | Freq: Two times a day (BID) | CUTANEOUS | 1 refills | Status: AC | PRN

## 2023-02-23 MED ORDER — HYDROXYCHLOROQUINE SULFATE 200 MG PO TABS: 400.0000 mg | ORAL_TABLET | Freq: Every day | ORAL | 1 refills | Status: DC

## 2023-02-23 NOTE — Patient Instructions (Signed)
Azathioprine Tablets What is this medication? AZATHIOPRINE (ay za THYE oh preen) prevents the body from rejecting an organ transplant. It works by lowering the body's immune system response. This helps the body accept the donor organ. It may also be used to treat rheumatoid arthritis. This medicine may be used for other purposes; ask your health care provider or pharmacist if you have questions. COMMON BRAND NAME(S): Azasan, Imuran What should I tell my care team before I take this medication? They need to know if you have any of these conditions: Infection Kidney disease Liver disease An unusual or allergic reaction to azathioprine, lactose, other medications, foods, dyes, or preservatives Pregnant or trying to get pregnant Breastfeeding How should I use this medication? Take this medication by mouth with a full glass of water. Take it as directed on the prescription label at the same time every day. Keep taking it unless your care team tells you to stop. Keep taking it even if you think you are better. Talk to your care team about the use of this medication in children. Special care may be needed. Overdosage: If you think you have taken too much of this medicine contact a poison control center or emergency room at once. NOTE: This medicine is only for you. Do not share this medicine with others. What if I miss a dose? If you miss a dose, take it as soon as you can. If it is almost time for your next dose, take only that dose. Do not take double or extra doses. What may interact with this medication? Do not take this medication with any of the following: Febuxostat Mercaptopurine This medication may also interact with the following: Allopurinol Aminosalicylates, such as sulfasalazine, mesalamine, balsalazide, and olsalazine Leflunomide Medications called ACE inhibitors, such as benazepril, captopril, enalapril, fosinopril, quinapril, lisinopril, ramipril, and  trandolapril Mycophenolate Sulfamethoxazole; trimethoprim Vaccines Warfarin This list may not describe all possible interactions. Give your health care provider a list of all the medicines, herbs, non-prescription drugs, or dietary supplements you use. Also tell them if you smoke, drink alcohol, or use illegal drugs. Some items may interact with your medicine. What should I watch for while using this medication? Visit your care team for regular checks on your progress. You may need blood work done while you are taking this medication. This medication may increase your risk of getting an infection. Call your care team for advice if you get a fever, chills, sore throat, or other symptoms of a cold or flu. Do not treat yourself. Try to avoid being around people who are sick. Talk to your care team about your risk of cancer. You may be more at risk for certain types of cancer if you take this medication. Talk to your care team if you may be pregnant. This medication can cause serious birth defects if taken during pregnancy. This medication may cause infertility. Talk to your care team if you are concerned about your fertility. What side effects may I notice from receiving this medication? Side effects that you should report to your care team as soon as possible: Allergic reactions--skin rash, itching, hives, swelling of the face, lips, tongue, or throat Change in your skin, such as a new growth, a sore that doesn't heal, or a change in a mole Dizziness, loss of balance or coordination, confusion or trouble speaking Infection--fever, chills, cough, sore throat, wounds that don't heal, pain or trouble when passing urine, general feeling of discomfort or being unwell Low red blood cell level--unusual  weakness or fatigue, dizziness, headache, trouble breathing Unusual bruising or bleeding Side effects that usually do not require medical attention (report to your care team if they continue or are  bothersome): Diarrhea Fatigue Nausea Vomiting This list may not describe all possible side effects. Call your doctor for medical advice about side effects. You may report side effects to FDA at 1-800-FDA-1088. Where should I keep my medication? Keep out of the reach of children and pets. Store at room temperature between 15 and 25 degrees C (59 and 77 degrees F). Protect from light. Get rid of any unused medication after the expiration date. To get rid of medications that are no longer needed or have expired: Take the medication to a medication take-back program. Check with your pharmacy or law enforcement to find a location. If you cannot return the medication, check the label or package insert to see if the medication should be thrown out in the garbage or flushed down the toilet. If you are not sure, ask your care team. If it is safe to put it in the trash, empty the medication out of the container. Mix the medication with cat litter, dirt, coffee grounds, or other unwanted substance. Seal the mixture in a bag or container. Put it in the trash. NOTE: This sheet is a summary. It may not cover all possible information. If you have questions about this medicine, talk to your doctor, pharmacist, or health care provider.  2024 Elsevier/Gold Standard (2021-08-11 00:00:00)

## 2023-02-24 LAB — CBC WITH DIFFERENTIAL/PLATELET
Absolute Lymphocytes: 1417 {cells}/uL (ref 850–3900)
Absolute Monocytes: 877 {cells}/uL (ref 200–950)
Basophils Absolute: 30 {cells}/uL (ref 0–200)
Basophils Relative: 0.8 %
Eosinophils Absolute: 148 {cells}/uL (ref 15–500)
Eosinophils Relative: 4 %
HCT: 30.6 % — ABNORMAL LOW (ref 35.0–45.0)
Hemoglobin: 9.3 g/dL — ABNORMAL LOW (ref 11.7–15.5)
MCH: 24.1 pg — ABNORMAL LOW (ref 27.0–33.0)
MCHC: 30.4 g/dL — ABNORMAL LOW (ref 32.0–36.0)
MCV: 79.3 fL — ABNORMAL LOW (ref 80.0–100.0)
MPV: 10.4 fL (ref 7.5–12.5)
Monocytes Relative: 23.7 %
Neutro Abs: 1228 {cells}/uL — ABNORMAL LOW (ref 1500–7800)
Neutrophils Relative %: 33.2 %
Platelets: 280 10*3/uL (ref 140–400)
RBC: 3.86 10*6/uL (ref 3.80–5.10)
RDW: 14.2 % (ref 11.0–15.0)
Total Lymphocyte: 38.3 %
WBC: 3.7 10*3/uL — ABNORMAL LOW (ref 3.8–10.8)

## 2023-02-24 LAB — IRON,TIBC AND FERRITIN PANEL
%SAT: 34 % (ref 16–45)
Ferritin: 11 ng/mL — ABNORMAL LOW (ref 16–232)
Iron: 114 ug/dL (ref 40–190)
TIBC: 332 ug/dL (ref 250–450)

## 2023-02-24 LAB — COMPLETE METABOLIC PANEL WITH GFR
AG Ratio: 0.5 (calc) — ABNORMAL LOW (ref 1.0–2.5)
ALT: 10 U/L (ref 6–29)
AST: 20 U/L (ref 10–30)
Albumin: 3.4 g/dL — ABNORMAL LOW (ref 3.6–5.1)
Alkaline phosphatase (APISO): 81 U/L (ref 31–125)
BUN: 9 mg/dL (ref 7–25)
CO2: 24 mmol/L (ref 20–32)
Calcium: 8.9 mg/dL (ref 8.6–10.2)
Chloride: 107 mmol/L (ref 98–110)
Creat: 0.81 mg/dL (ref 0.50–0.99)
Globulin: 6.3 g/dL — ABNORMAL HIGH (ref 1.9–3.7)
Glucose, Bld: 80 mg/dL (ref 65–99)
Potassium: 3.8 mmol/L (ref 3.5–5.3)
Sodium: 137 mmol/L (ref 135–146)
Total Bilirubin: 0.7 mg/dL (ref 0.2–1.2)
Total Protein: 9.7 g/dL — ABNORMAL HIGH (ref 6.1–8.1)
eGFR: 92 mL/min/{1.73_m2} (ref >=60)

## 2023-02-24 LAB — C3 AND C4
C3 Complement: 108 mg/dL (ref 83–193)
C4 Complement: 13 mg/dL — ABNORMAL LOW (ref 15–57)

## 2023-02-24 LAB — SEDIMENTATION RATE: Sed Rate: 86 mm/h — ABNORMAL HIGH (ref 0–20)

## 2023-02-25 NOTE — Progress Notes (Signed)
Sedimentation rate of 86 and is increased and complement C4 of 13 is decreased.  These are consistent with active inflammation.  White blood count is slightly low at 3.7 but this is not particularly less than her baseline.

## 2023-03-05 ENCOUNTER — Other Ambulatory Visit: Payer: Self-pay

## 2023-03-05 ENCOUNTER — Emergency Department (HOSPITAL_COMMUNITY)
Admission: EM | Admit: 2023-03-05 | Discharge: 2023-03-05 | Disposition: A | Discharge status: Home / Self Care | Payer: Managed Care, Other (non HMO) | Attending: Emergency Medicine | Admitting: Emergency Medicine

## 2023-03-05 ENCOUNTER — Emergency Department (HOSPITAL_COMMUNITY): Payer: Managed Care, Other (non HMO)

## 2023-03-05 DIAGNOSIS — R0789 Other chest pain: Secondary | ICD-10-CM | POA: Diagnosis not present

## 2023-03-05 DIAGNOSIS — R079 Chest pain, unspecified: Secondary | ICD-10-CM | POA: Diagnosis present

## 2023-03-05 LAB — BASIC METABOLIC PANEL
Anion gap: 5 (ref 5–15)
BUN: 12 mg/dL (ref 6–20)
CO2: 20 mmol/L — ABNORMAL LOW (ref 22–32)
Calcium: 8.8 mg/dL — ABNORMAL LOW (ref 8.9–10.3)
Chloride: 108 mmol/L (ref 98–111)
Creatinine, Ser: 1.03 mg/dL — ABNORMAL HIGH (ref 0.44–1.00)
GFR, Estimated: >60 mL/min (ref >60)
Glucose, Bld: 103 mg/dL — ABNORMAL HIGH (ref 70–99)
Potassium: 3.7 mmol/L (ref 3.5–5.1)
Sodium: 133 mmol/L — ABNORMAL LOW (ref 135–145)

## 2023-03-05 LAB — TROPONIN I (HIGH SENSITIVITY)
Troponin I (High Sensitivity): 3 ng/L (ref <18)
Troponin I (High Sensitivity): 2 ng/L (ref <18)

## 2023-03-05 LAB — CBC
HCT: 31.5 % — ABNORMAL LOW (ref 36.0–46.0)
Hemoglobin: 9.5 g/dL — ABNORMAL LOW (ref 12.0–15.0)
MCH: 23.9 pg — ABNORMAL LOW (ref 26.0–34.0)
MCHC: 30.2 g/dL (ref 30.0–36.0)
MCV: 79.3 fL — ABNORMAL LOW (ref 80.0–100.0)
Platelets: 312 10*3/uL (ref 150–400)
RBC: 3.97 MIL/uL (ref 3.87–5.11)
RDW: 14.6 % (ref 11.5–15.5)
WBC: 6.1 10*3/uL (ref 4.0–10.5)
nRBC: 0 % (ref 0.0–0.2)

## 2023-03-05 LAB — HCG, SERUM, QUALITATIVE: Preg, Serum: NEGATIVE

## 2023-03-05 MED ORDER — MORPHINE SULFATE (PF) 4 MG/ML IV SOLN
4.0000 mg | Freq: Once | INTRAVENOUS | Status: AC
  Administered 2023-03-05: 4 mg via INTRAVENOUS
  Filled 2023-03-05: qty 1

## 2023-03-05 MED ORDER — IOHEXOL 350 MG/ML SOLN
75.0000 mL | Freq: Once | INTRAVENOUS | Status: AC | PRN
  Administered 2023-03-05: 75 mL via INTRAVENOUS

## 2023-03-05 NOTE — Discharge Instructions (Signed)
Return for any problem.  ?

## 2023-03-05 NOTE — ED Triage Notes (Signed)
Patient with L central chest pain, LUE pain, and central back pain with every inhalation since Tuesday. Was taking ibuprofen which was helping, but states it is no longer working. Hx of same presentation one year ago but has been dx with Lupus since then and has had recent changes in her Lupus medication.

## 2023-03-05 NOTE — ED Provider Notes (Signed)
[facility] AT [facility] Provider Note   CSN: [id number] Arrival date & time: 03/05/23  1227     History {Add pertinent medical, surgical, social history, OB history to HPI:1} Chief Complaint  Patient presents with   Chest Pain    Connie Foster is a 42 y.o. female.  42 year old female with prior medical history as detailed below presents for evaluation.  She complains of left-sided chest discomfort.  Patient's pain began approximately 4 days ago.  She does use some ibuprofen with intermittent improvement in symptoms.  Patient's pain is most noted with deep breath or with movement.  She reports the pain is primarily in the left chest with some radiation into the left upper back.  Prior medical history is most significant for apparent systemic lupus.  She reports compliance with previously prescribed immunomodulator's.   The history is provided by the patient and medical records.       Home Medications Prior to Admission medications   Medication Sig Start Date End Date Taking? Authorizing Provider  azaTHIOprine (IMURAN) 50 MG tablet Take 1 tablet (50 mg total) by mouth daily. 02/23/23   Rice, Jamesetta Orleans, MD  hydroxychloroquine (PLAQUENIL) 200 MG tablet Take 2 tablets (400 mg total) by mouth daily. 02/23/23   Rice, Jamesetta Orleans, MD  levonorgestrel (MIRENA, 52 MG,) 20 MCG/24HR IUD Mirena 20 mcg/24 hours (6 yrs) 52 mg intrauterine device  Take 1 device by intrauterine route.    [provider]  ondansetron (ZOFRAN-ODT) 4 MG disintegrating tablet Take 1 tablet (4 mg total) by mouth every 8 (eight) hours as needed for nausea or vomiting. Patient not taking: Reported on 02/23/2023 01/29/22   Renne Crigler, PA-C  triamcinolone ointment (KENALOG) 0.5 % Apply 1 Application topically 2 (two) times daily as needed (Rash). 02/23/23   Rice, Jamesetta Orleans, MD      Allergies    Lisinopril    Review of Systems   Review of Systems  All other  systems reviewed and are negative.   Physical Exam Updated Vital Signs BP 119/72   Pulse 74   Temp 98.1 F (36.7 C) (Oral)   Resp 17   Ht 5\' 5"  (1.651 m)   Wt 93 kg   SpO2 100%   BMI 34.11 kg/m  Physical Exam Vitals and nursing note reviewed.  Constitutional:      General: She is not in acute distress.    Appearance: Normal appearance. She is well-developed.  HENT:     Head: Normocephalic and atraumatic.  Eyes:     Conjunctiva/sclera: Conjunctivae normal.     Pupils: Pupils are equal, round, and reactive to light.  Cardiovascular:     Rate and Rhythm: Normal rate and regular rhythm.     Heart sounds: Normal heart sounds.  Pulmonary:     Effort: Pulmonary effort is normal. No respiratory distress.     Breath sounds: Normal breath sounds.  Abdominal:     General: There is no distension.     Palpations: Abdomen is soft.     Tenderness: There is no abdominal tenderness.  Musculoskeletal:        General: No deformity. Normal range of motion.     Cervical back: Normal range of motion and neck supple.  Skin:    General: Skin is warm and dry.  Neurological:     General: No focal deficit present.     Mental Status: She is alert and oriented to person, place, and time.  ED Results / Procedures / Treatments   Labs (all labs ordered are listed, but only abnormal results are displayed) Labs Reviewed  BASIC METABOLIC PANEL - Abnormal; Notable for the following components:      Result Value   Sodium 133 (*)    CO2 20 (*)    Glucose, Bld 103 (*)    Creatinine, Ser 1.03 (*)    Calcium 8.8 (*)    All other components within normal limits  CBC - Abnormal; Notable for the following components:   Hemoglobin 9.5 (*)    HCT 31.5 (*)    MCV 79.3 (*)    MCH 23.9 (*)    All other components within normal limits  HCG, SERUM, QUALITATIVE  TROPONIN I (HIGH SENSITIVITY)  TROPONIN I (HIGH SENSITIVITY)    EKG EKG Interpretation Date/Time:  Saturday March 05 2023 12:31:42  EST Ventricular Rate:  84 PR Interval:  158 QRS Duration:  86 QT Interval:  338 QTC Calculation: 399 R Axis:   48  Text Interpretation: Normal sinus rhythm Cannot rule out Anterior infarct , age undetermined Abnormal ECG When compared with ECG of 29-Jan-2022 05:08, PREVIOUS ECG IS PRESENT Confirmed by Kristine Royal [phone]) on 03/05/2023 2:57:10 PM  Radiology DG Chest 2 View Result Date: 03/05/2023 CLINICAL DATA:  Chest pain EXAM: CHEST - 2 VIEW COMPARISON:  01/29/2022 FINDINGS: Lungs are clear.  No pneumothorax. Heart size and mediastinal contours are within normal limits. No effusion. Visualized bones unremarkable. IMPRESSION: No acute cardiopulmonary disease. Electronically Signed   By: Corlis Leak M.D.   On: 03/05/2023 13:36    Procedures Procedures  {Document cardiac monitor, telemetry assessment procedure when appropriate:1}  Medications Ordered in ED Medications - No data to display  ED Course/ Medical Decision Making/ A&P   {   Click here for ABCD2, HEART and other calculatorsREFRESH Note before signing :1}                              Medical Decision Making Amount and/or Complexity of Data Reviewed Labs: ordered. Radiology: ordered.    Medical Screen Complete  This patient presented to the ED with complaint of ***.  This complaint involves an extensive number of treatment options. The initial differential diagnosis includes, but is not limited to, ***  This presentation is: {IllnessRisk:19196::"***","Acute","Chronic","Self-Limited","Previously Undiagnosed","Uncertain Prognosis","Complicated","Systemic Symptoms","Threat to Life/Bodily Function"}    Co morbidities that complicated the patient's evaluation  ***   Additional history obtained:  Additional history obtained from {History source:19196::"EMS","Spouse","Family","Friend","Caregiver"} External records from outside sources obtained and reviewed including prior ED visits and prior Inpatient records.     Lab Tests:  I ordered and personally interpreted labs.  The pertinent results include:  ***   Imaging Studies ordered:  I ordered imaging studies including ***  I independently visualized and interpreted obtained imaging which showed *** I agree with the radiologist interpretation.   Cardiac Monitoring:  The patient was maintained on a cardiac monitor.  I personally viewed and interpreted the cardiac monitor which showed an underlying rhythm of: ***   Medicines ordered:  I ordered medication including ***  for ***  Reevaluation of the patient after these medicines showed that the patient: {resolved/improved/worsened:23923::"improved"}    Test Considered:  ***   Critical Interventions:  ***   Consultations Obtained:  I consulted ***,  and discussed lab and imaging findings as well as pertinent plan of care.    Problem List / ED Course:  ***  Reevaluation:  After the interventions noted above, I reevaluated the patient and found that they have: {resolved/improved/worsened:23923::"improved"}   Social Determinants of Health:  ***   Disposition:  After consideration of the diagnostic results and the patients response to treatment, I feel that the patent would benefit from ***.    {Document critical care time when appropriate:1} {Document review of labs and clinical decision tools ie heart score, Chads2Vasc2 etc:1}  {Document your independent review of radiology images, and any outside records:1} {Document your discussion with family members, caretakers, and with consultants:1} {Document social determinants of health affecting pt's care:1} {Document your decision making why or why not admission, treatments were needed:1} Final Clinical Impression(s) / ED Diagnoses Final diagnoses:  None    Rx / DC Orders ED Discharge Orders     None

## 2023-03-11 ENCOUNTER — Other Ambulatory Visit (HOSPITAL_COMMUNITY): Payer: Self-pay | Admitting: Physician Assistant

## 2023-03-11 DIAGNOSIS — R079 Chest pain, unspecified: Secondary | ICD-10-CM

## 2023-03-17 ENCOUNTER — Other Ambulatory Visit: Payer: Self-pay | Admitting: Internal Medicine

## 2023-03-17 DIAGNOSIS — M350C Sjogren syndrome with dental involvement: Secondary | ICD-10-CM

## 2023-04-06 DIAGNOSIS — Z1231 Encounter for screening mammogram for malignant neoplasm of breast: Secondary | ICD-10-CM | POA: Diagnosis not present

## 2023-04-06 DIAGNOSIS — Z01419 Encounter for gynecological examination (general) (routine) without abnormal findings: Secondary | ICD-10-CM | POA: Diagnosis not present

## 2023-04-13 ENCOUNTER — Other Ambulatory Visit (HOSPITAL_COMMUNITY): Payer: Managed Care, Other (non HMO)

## 2023-04-14 NOTE — Progress Notes (Signed)
 Office Visit Note  Patient: Connie Foster             Date of Birth: 05-19-1980           MRN: [id number]             PCP: Stacia Millman, PA Referring: Lorren Greig PARAS, NP Visit Date: 04/27/2023   Subjective:  Follow-up   Discussed the use of AI scribe software for clinical note transcription with the patient, who gave verbal consent to proceed.  History of Present Illness   Connie Foster is a 43 y.o. female here for follow up for history of systemic lupus with fatigue, joint pain, abnormal liver function, and leukopenia now on hydroxychloroquine  400 mg daily and started azathioprine  50 mg daily.    She is following up for her autoimmune condition, which has been associated with skin flares. Since starting azathioprine  at one pill a day, some of her rashes have improved, but she has developed new bruises. These bruises are distinct from her usual rashes and are not preceded by hives. No history of easy bruising prior to this.  She is experiencing a rash on the bottom of her foot, which has been painful and made walking difficult. The rash is currently healing but was described as 'horrible' and 'huge', with skin peeling off and remaining red along the border. She continues to use an ointment for this.  In December, she experienced a severe episode of chest pain and difficulty breathing, described as the worst pain she has ever experienced. The pain started in her lower back, radiated over her shoulder, and into her chest. It was exacerbated by lying down, forcing her to sleep sitting up for two weeks. She visited the ER, where various tests were conducted, and her primary care physician prescribed meloxicam, which helped alleviate the pain. She also received an injection of toradol IM that significantly improved her symptoms. She has not taken any steroids since her last visit.    Previous HPI 02/23/2023 Connie Foster is a 43 y.o. female here for follow up for history of  systemic lupus with fatigue, joint pain, abnormal liver function, and leukopenia now on hydroxychloroquine  400 mg daily.  She presents with a chief complaint of recurrent, widespread rashes that have been progressively worsening over the past month. The rashes, which are initially painful and subsequently become itchy, are located on the arms, legs, face, and ears. The patient reports that the individual spots last for approximately two weeks when treated with their son's eczema cream, but prior to this, the spots would persist for months.   The patient has been taking hydroxychloroquine  for an unspecified condition, and despite this, the rashes have continued to appear. The patient denies any recent illnesses and reports that this is a new symptom. They have not experienced rashes like this before.   The patient also reports persistent soreness in an unspecified area and swollen lymph nodes. Despite these symptoms, the patient reports feeling generally well and has been able to receive their flu and COVID vaccinations without any adverse reactions.      Previous HPI 07/27/2022 Connie Foster is a 43 y.o. female here for follow up for history of systemic lupus with fatigue, joint pain, abnormal liver function, and leukopenia now on hydroxychloroquine  400 mg daily.  Overall she feels pretty well still has dry eyes and mouth, fatigue, and joint stiffness but symptoms are manageable.  She is using oral lozenges or gum and staying well-hydrated for dry mouth.  Not requiring regular use of OTC anti-inflammatories for joint pain.  She has not experienced any interval infections or complications. Continues to have tenderness and swelling around the neck and base of the jaw.   Previous HPI 04/28/22 Connie Foster is a 43 y.o. female here for follow up for lupus now on hydroxychloroquine  400 mg daily.  Since her last visit she had the liver biopsy obtained and findings were benign with no inflammatory  changes identified.  She subsequently started on hydroxychloroquine  initially at 200 and then at 400 she has noticed an improvement with fatigue and urinary frequency, partial improvement with stiffness.  She did not notice any side effects with starting the medicine though for the past few weeks has had decreased appetite and for the past 1 week is experiencing frequent heart palpitations.  These last for only a few seconds at a time but are pretty consistently occurring every day. She does not recall similar symptoms in the past. Not associated with any chest pain, dizziness, lightheadedness, shortness of breath, or leg swelling.   Previous HPI 01/21/22 Connie Foster is a 43 y.o. female here for follow up for history of lupus with recent onset of more symptoms and also getting evaluation for possible autoimmune hepatitis.  Laboratory testing at our visit was negative for lupus specific antibody markers but did have elevated sedimentation rate of 108.  Also had IgG serum level checked after that which was over 4500.  Overall findings were concerning and has been scheduled for planned liver biopsy next week.  Her symptoms remain basically the same without any change in the past few weeks.   Previous HPI 12/25/2021 Connie Foster is a 43 y.o. female here for history of lupus.  This original diagnosis was 14 years ago on the basis of abnormal lab findings but did not have signs or symptomatic complaints at the time so was never started on any treatment.  She was not seeing a rheumatologist at the time.  More recently had some repeat work-up being evaluated due to feeling worse with new symptoms since about 4 months ago.  This includes evaluation in gastroenterology clinic with elevated liver enzymes which are new.  She has new problems with headache and vertigo symptoms.  Left hip pain is a more chronic problem but has been increased more than usual.  She is experiencing constipation usually without  severe abdominal pain or other GI issues.  She does describe a sensation of dry mouth and increasing urinary frequency compared to her usual.  Overall exertion tolerance is worse than before.  For the past 1 month symptoms have been bad enough that she is stopped doing any routine exercise she previously had been very regular with multiple times per week aerobic exercises.  She had a skin rash on the shins recently that was treated with topical prescription medication and resolves before now.  She denies any issues with oral or nasal ulcers, lymphadenopathy, Raynaud's symptoms, or photosensitive skin rashes.   Labs reviewed 12/2021 ANA 1:1280 fine speckled Smooth muscle Ab neg Mitochondrial Ab neg Ttg IgA neg   10/2021 CBC WBC 3.8 Hgb 9.6 AST 100 ALT 98 SPEP polyclonal gamma globulin increase HAV Ab pos HBV sAb pos HBV sAg neg   Review of Systems  Constitutional:  Positive for fatigue.  HENT:  Positive for mouth dryness. Negative for mouth sores.   Eyes:  Positive for dryness.  Respiratory:  Negative for shortness of breath.   Cardiovascular:  Negative for chest pain and  palpitations.  Gastrointestinal:  Negative for blood in stool, constipation and diarrhea.  Endocrine: Negative for increased urination.  Genitourinary:  Negative for involuntary urination.  Musculoskeletal:  Positive for morning stiffness. Negative for joint pain, gait problem, joint pain, joint swelling, myalgias, muscle weakness, muscle tenderness and myalgias.  Skin:  Positive for color change and rash. Negative for hair loss and sensitivity to sunlight.  Allergic/Immunologic: Negative for susceptible to infections.  Neurological:  Positive for dizziness and headaches.  Hematological:  Negative for swollen glands.  Psychiatric/Behavioral:  Negative for depressed mood and sleep disturbance. The patient is nervous/anxious.     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   Anemia 04/28/2022   Palpitations 04/28/2022   Systemic lupus erythematosus (HCC) 12/25/2021   Vertigo 12/25/2021   Pain in left hip 12/25/2021   Liver function test abnormality 12/25/2021   Urinary frequency 12/25/2021   Lung nodule    Dyspnea 03/12/2015   Hypokalemia 03/12/2015   Reactive airway disease with wheezing with acute exacerbation 03/12/2015    Past Medical History:  Diagnosis Date   History of hypertension    Hypokalemia    Systemic lupus erythematosus (HCC)     Family History  Problem Relation Age of Onset   Hypertension Mother    Allergies Mother    Diabetes Mother    Healthy Father    Thyroid  disease Brother    Colon cancer Maternal Uncle    Breast cancer Maternal Grandmother    Healthy Son    Esophageal cancer Neg Hx    Stomach cancer Neg Hx    Rectal cancer Neg Hx    Past Surgical History:  Procedure Laterality Date   CESAREAN SECTION     SLEEVE GASTROPLASTY     TONSILLECTOMY     Social History   Social History Narrative   Not on file   Immunization History  Administered Date(s) Administered   Influenza,inj,Quad PF,6+ Mos 12/19/2018   Influenza-Unspecified 12/22/2020   Moderna SARS-COV2 Booster Vaccination 12/24/2020   Moderna Sars-Covid-2 Vaccination 06/10/2019, 07/08/2019, 02/24/2020   Pfizer Covid-19 Vaccine Bivalent Booster 10yrs & up 12/22/2020   Tdap 12/19/2018     Objective: Vital Signs: BP 124/84 (BP Location: Left Arm, Patient Position: Sitting, Cuff Size: Large)   Pulse 76   Resp 14   Ht 5' 5 (1.651 m)   Wt 204 lb (92.5 kg)   BMI 33.95 kg/m    Physical Exam Eyes:     Conjunctiva/sclera: Conjunctivae normal.  Cardiovascular:     Rate and Rhythm: Normal rate and regular rhythm.  Pulmonary:     Effort: Pulmonary effort is normal.     Breath sounds: Normal breath sounds.  Musculoskeletal:     Right lower leg: No edema.     Left lower leg: No edema.   Lymphadenopathy:     Cervical: No cervical adenopathy.  Skin:    General: Skin is warm and dry.     Findings: Rash present.  Neurological:     Mental Status: She is alert.  Psychiatric:        Mood and Affect: Mood normal.     Musculoskeletal Exam:  Elbows full ROM no tenderness or swelling Wrists full ROM no tenderness or swelling Fingers full ROM no tenderness or swelling Knees full ROM no tenderness or swelling        Investigation: No additional findings.  Imaging: ECHOCARDIOGRAM  COMPLETE Result Date: 04/25/2023    ECHOCARDIOGRAM REPORT   Patient Name:   Ophelia Beane Date of Exam: 04/25/2023 Medical Rec #:  [id number]         Height:       65.0 in Accession #:    [id number]        Weight:       205.0 lb Date of Birth:  June 22, 1980          BSA:          1.999 m Patient Age:    42 years          BP:           126/78 mmHg Patient Gender: F                 HR:           78 bpm. Exam Location:  [address] Procedure: 2D Echo, 3D Echo, Cardiac Doppler and Color Doppler Indications:    R07.9 Chest Pain  History:        Patient has no prior history of Echocardiogram examinations.                 Signs/Symptoms:Chest Pain; Risk Factors:Family History of                 Coronary Artery Disease and Hypertension. Systemic Lupus                 Erythmatosis.  Sonographer:    Heather Hawks RDCS Referring Phys: LATRELLE HOLT IMPRESSIONS  1. Left ventricular ejection fraction, by estimation, is 60 to 65%. The left ventricle has normal function. The left ventricle has no regional wall motion abnormalities. Left ventricular diastolic parameters were normal.  2. Right ventricular systolic function is normal. The right ventricular size is normal. There is mildly elevated pulmonary artery systolic pressure. The estimated right ventricular systolic pressure is 38.0 mmHg.  3. Left atrial size was mildly dilated.  4. Right atrial size was mildly dilated.  5. The mitral valve is normal in  structure. Trivial mitral valve regurgitation. No evidence of mitral stenosis.  6. The aortic valve is tricuspid. Aortic valve regurgitation is not visualized. No aortic stenosis is present.  7. The inferior vena cava is dilated in size with <50% respiratory variability, suggesting right atrial pressure of 15 mmHg.  8. Evidence of atrial level shunting detected by color flow Doppler (image 91). Comparison(s): No prior Echocardiogram. FINDINGS  Left Ventricle: Left ventricular ejection fraction, by estimation, is 60 to 65%. The left ventricle has normal function. The left ventricle has no regional wall motion abnormalities. The left ventricular internal cavity size was normal in size. There is  no left ventricular hypertrophy. Left ventricular diastolic parameters were normal. Right Ventricle: The right ventricular size is normal. No increase in right ventricular wall thickness. Right ventricular systolic function is normal. There is mildly elevated pulmonary artery systolic pressure. The tricuspid regurgitant velocity is 2.40  m/s, and with an assumed right atrial pressure of 15 mmHg, the estimated right ventricular systolic pressure is 38.0 mmHg. Left Atrium: Left atrial size was mildly dilated. Right Atrium: Right atrial size was mildly dilated. Pericardium: There is no evidence of pericardial effusion. Mitral Valve: The mitral valve is normal in structure. Trivial mitral valve regurgitation. No evidence of mitral valve stenosis. Tricuspid Valve: The tricuspid valve is normal in structure. Tricuspid valve regurgitation is mild . No evidence of tricuspid stenosis. Aortic Valve: The aortic valve is tricuspid.  Aortic valve regurgitation is not visualized. No aortic stenosis is present. Pulmonic Valve: The pulmonic valve was normal in structure. Pulmonic valve regurgitation is mild. No evidence of pulmonic stenosis. Aorta: The aortic root is normal in size and structure. Venous: The inferior vena cava is dilated in  size with less than 50% respiratory variability, suggesting right atrial pressure of 15 mmHg. IAS/Shunts: Evidence of atrial level shunting detected by color flow Doppler (image 91).  LEFT VENTRICLE PLAX 2D LVIDd:         4.80 cm   Diastology LVIDs:         3.00 cm   LV e' medial:    10.40 cm/s LV PW:         0.90 cm   LV E/e' medial:  8.6 LV IVS:        0.90 cm   LV e' lateral:   15.70 cm/s LVOT diam:     2.00 cm   LV E/e' lateral: 5.7 LV SV:         75 LV SV Index:   38 LVOT Area:     3.14 cm                           3D Volume EF:                          3D EF:        70 %                          LV EDV:       126 ml                          LV ESV:       38 ml                          LV SV:        89 ml RIGHT VENTRICLE RV Basal diam:  4.70 cm RV Mid diam:    2.60 cm RV S prime:     17.90 cm/s TAPSE (M-mode): 2.9 cm RVSP:           26.0 mmHg LEFT ATRIUM             Index        RIGHT ATRIUM           Index LA diam:        4.30 cm 2.15 cm/m   RA Pressure: 3.00 mmHg LA Vol (A2C):   83.0 ml 41.52 ml/m  RA Area:     22.20 cm LA Vol (A4C):   60.5 ml 30.27 ml/m  RA Volume:   76.40 ml  38.22 ml/m LA Biplane Vol: 73.4 ml 36.72 ml/m  AORTIC VALVE LVOT Vmax:   119.00 cm/s LVOT Vmean:  77.550 cm/s LVOT VTI:    0.240 m  AORTA Ao Root diam: 2.70 cm Ao Asc diam:  3.20 cm MITRAL VALVE               TRICUSPID VALVE MV Area (PHT): cm         TR Peak grad:   23.0 mmHg MV Decel Time: 168 msec    TR Vmax:        240.00 cm/s MV E velocity: 89.77 cm/s  Estimated RAP:  3.00 mmHg MV A velocity: 65.13 cm/s  RVSP:           26.0 mmHg MV E/A ratio:  1.38                            SHUNTS                            Systemic VTI:  0.24 m                            Systemic Diam: 2.00 cm Sunit Tolia Electronically signed by Madonna Large Signature Date/Time: 04/25/2023/3:01:44 PM    Final     Recent Labs: Lab Results  Component Value Date   WBC 6.1 03/05/2023   HGB 9.5 (L) 03/05/2023   PLT 312 03/05/2023   NA 133 (L)  03/05/2023   K 3.7 03/05/2023   CL 108 03/05/2023   CO2 20 (L) 03/05/2023   GLUCOSE 103 (H) 03/05/2023   BUN 12 03/05/2023   CREATININE 1.03 (H) 03/05/2023   BILITOT 0.7 02/23/2023   ALKPHOS 61 01/29/2022   AST 20 02/23/2023   ALT 10 02/23/2023   PROT 9.7 (H) 02/23/2023   ALBUMIN 3.3 (L) 01/29/2022   CALCIUM 8.8 (L) 03/05/2023   GFRAA >60 03/13/2015    Speciality Comments: PLQ EYE EXAM 03/29/2022 Normal [facility] f/u 5 years  Procedures:  No procedures performed Allergies: Lisinopril   Assessment / Plan:     Visit Diagnoses: Systemic lupus erythematosus, unspecified SLE type, unspecified organ involvement status (HCC) Improvement in some rashes with new onset of others. Noted easy bruising, possibly related to Hydroxychloroquine . 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 -Checking sed rate, dsDNA, Sm, comeplements -Increase azathioprine  to 100 mg daily -Consider reducing Hydroxychloroquine  if bruising continues but continue 400 mg daily for now  High risk medication use - azathioprine  50 mg daily, hydroxychloroquine  400 mg daily. PLQ EYE EXAM 03/29/2022 Normal. Needs updated PLQ eye exam. -Checking CBC and CMP for medication monitoring no on azathioprine  and HCQ  Urticarial vasculitis - triamcinolone  ointment (KENALOG ) 0.5 % Foot Rash Healing but still present. -Continue current topical treatment.  Pleurisy Recent episode of severe chest and back pain, worse with deep breaths and lying flat, consistent with pleurisy. Symptoms are now resolved. -Additional workup symptoms more concerning for SLE picture as above   Orders: Orders Placed This Encounter  Procedures   Sedimentation rate   C3 and C4   CBC with Differential/Platelet   COMPLETE METABOLIC PANEL WITH GFR   Anti-DNA antibody, double-stranded   Sjogren's syndrome antibods(ssa + ssb)    Anti-Smith antibody   RNP Antibody   No orders of the defined types were placed in this encounter.    Follow-Up Instructions: Return in about 3 months (around 07/25/2023) for ?SLE/HUV AZA/HCQ f/u 3mos.   Lonni LELON Ester, MD  Note - This record has been created using [employer].  Chart creation errors have been sought, but may not always  have been located. Such creation errors do not reflect on  the standard of medical care.

## 2023-04-22 ENCOUNTER — Other Ambulatory Visit: Payer: Self-pay | Admitting: Internal Medicine

## 2023-04-22 DIAGNOSIS — M350C Sjogren syndrome with dental involvement: Secondary | ICD-10-CM

## 2023-04-22 NOTE — Telephone Encounter (Signed)
Last Fill: 02/23/2023  Labs: 03/05/2023 CBC & BMP Hemoglobin 9.5 HCT 31.5 MCV 79.3 MCH 23.9 Sodium 133 CO2 20 Glucose 103 Creatinine 1.03 Calcium 8.8  Next Visit: 04/27/2023  Last Visit: 02/23/2023  DX: Sjogren's syndrome with dental involvement (HCC)  Systemic lupus erythematosus, unspecified SLE type, unspecified organ involvement status  Current Dose per office note on 02/23/2023: Start azathioprine 50 mg daily  Okay to refill Imuran?

## 2023-04-25 ENCOUNTER — Ambulatory Visit (HOSPITAL_COMMUNITY): Payer: BC Managed Care – PPO | Attending: Physician Assistant

## 2023-04-25 DIAGNOSIS — R079 Chest pain, unspecified: Secondary | ICD-10-CM | POA: Diagnosis not present

## 2023-04-25 LAB — ECHOCARDIOGRAM COMPLETE
Area-P 1/2: 4.52 cm2
S' Lateral: 3 cm

## 2023-04-27 ENCOUNTER — Encounter: Payer: Self-pay | Admitting: Internal Medicine

## 2023-04-27 ENCOUNTER — Ambulatory Visit: Payer: BC Managed Care – PPO | Attending: Internal Medicine | Admitting: Internal Medicine

## 2023-04-27 VITALS — BP 124/84 | HR 76 | Resp 14 | Ht 65.0 in | Wt 204.0 lb

## 2023-04-27 DIAGNOSIS — Z79899 Other long term (current) drug therapy: Secondary | ICD-10-CM | POA: Diagnosis not present

## 2023-04-27 DIAGNOSIS — D649 Anemia, unspecified: Secondary | ICD-10-CM

## 2023-04-27 DIAGNOSIS — M350C Sjogren syndrome with dental involvement: Secondary | ICD-10-CM | POA: Diagnosis not present

## 2023-04-27 DIAGNOSIS — M329 Systemic lupus erythematosus, unspecified: Secondary | ICD-10-CM | POA: Diagnosis not present

## 2023-04-27 DIAGNOSIS — L958 Other vasculitis limited to the skin: Secondary | ICD-10-CM

## 2023-04-29 LAB — C3 AND C4
C3 Complement: 106 mg/dL (ref 83–193)
C4 Complement: 11 mg/dL — ABNORMAL LOW (ref 15–57)

## 2023-04-29 LAB — COMPLETE METABOLIC PANEL WITH GFR
AG Ratio: 0.5 (calc) — ABNORMAL LOW (ref 1.0–2.5)
ALT: 9 U/L (ref 6–29)
AST: 23 U/L (ref 10–30)
Albumin: 3.7 g/dL (ref 3.6–5.1)
Alkaline phosphatase (APISO): 74 U/L (ref 31–125)
BUN: 21 mg/dL (ref 7–25)
CO2: 23 mmol/L (ref 20–32)
Calcium: 9.2 mg/dL (ref 8.6–10.2)
Chloride: 107 mmol/L (ref 98–110)
Creat: 0.84 mg/dL (ref 0.50–0.99)
Globulin: 6.8 g/dL — ABNORMAL HIGH (ref 1.9–3.7)
Glucose, Bld: 80 mg/dL (ref 65–99)
Potassium: 3.8 mmol/L (ref 3.5–5.3)
Sodium: 134 mmol/L — ABNORMAL LOW (ref 135–146)
Total Bilirubin: 0.5 mg/dL (ref 0.2–1.2)
Total Protein: 10.5 g/dL — ABNORMAL HIGH (ref 6.1–8.1)
eGFR: 88 mL/min/{1.73_m2} (ref >=60)

## 2023-04-29 LAB — CBC WITH DIFFERENTIAL/PLATELET
Absolute Lymphocytes: 1107 {cells}/uL (ref 850–3900)
Absolute Monocytes: 705 {cells}/uL (ref 200–950)
Basophils Absolute: 41 {cells}/uL (ref 0–200)
Basophils Relative: 1 %
Eosinophils Absolute: 111 {cells}/uL (ref 15–500)
Eosinophils Relative: 2.7 %
HCT: 30 % — ABNORMAL LOW (ref 35.0–45.0)
Hemoglobin: 9.1 g/dL — ABNORMAL LOW (ref 11.7–15.5)
MCH: 24.7 pg — ABNORMAL LOW (ref 27.0–33.0)
MCHC: 30.3 g/dL — ABNORMAL LOW (ref 32.0–36.0)
MCV: 81.3 fL (ref 80.0–100.0)
MPV: 10.7 fL (ref 7.5–12.5)
Monocytes Relative: 17.2 %
Neutro Abs: 2136 {cells}/uL (ref 1500–7800)
Neutrophils Relative %: 52.1 %
Platelets: 336 10*3/uL (ref 140–400)
RBC: 3.69 10*6/uL — ABNORMAL LOW (ref 3.80–5.10)
RDW: 14.5 % (ref 11.0–15.0)
Total Lymphocyte: 27 %
WBC: 4.1 10*3/uL (ref 3.8–10.8)

## 2023-04-29 LAB — ANTI-SMITH ANTIBODY: ENA SM Ab Ser-aCnc: <1 AI

## 2023-04-29 LAB — SJOGREN'S SYNDROME ANTIBODS(SSA + SSB)
SSA (Ro) (ENA) Antibody, IgG: >8 AI — AB
SSB (La) (ENA) Antibody, IgG: >8 AI — AB

## 2023-04-29 LAB — RNP ANTIBODY: Ribonucleic Protein(ENA) Antibody, IgG: <1 AI

## 2023-04-29 LAB — SEDIMENTATION RATE: Sed Rate: 104 mm/h — ABNORMAL HIGH (ref 0–20)

## 2023-04-29 LAB — ANTI-DNA ANTIBODY, DOUBLE-STRANDED: ds DNA Ab: 1 [IU]/mL

## 2023-05-02 ENCOUNTER — Encounter: Payer: Self-pay | Admitting: Internal Medicine

## 2023-05-02 DIAGNOSIS — M329 Systemic lupus erythematosus, unspecified: Secondary | ICD-10-CM

## 2023-05-02 DIAGNOSIS — M350C Sjogren syndrome with dental involvement: Secondary | ICD-10-CM

## 2023-05-03 NOTE — Telephone Encounter (Signed)
Patient is currently prescribed Azathioprine, Hydroxychloroquine, and Triamcinolone Cream. Please advise.

## 2023-05-04 ENCOUNTER — Telehealth: Payer: Self-pay

## 2023-05-04 MED ORDER — PREDNISONE 5 MG PO TABS: 5.0000 mg | ORAL_TABLET | Freq: Every day | ORAL | 1 refills | Status: DC

## 2023-05-04 MED ORDER — AZATHIOPRINE 50 MG PO TABS: 100.0000 mg | ORAL_TABLET | Freq: Every day | ORAL | 1 refills | Status: DC

## 2023-05-04 NOTE — Progress Notes (Signed)
 Sed rate is increased up to 104.  Complement C4 is decreased down to 11.  Serum immunoglobulin and total proteins are increased.  This is overall consistent with increased inflammation activity.  Her antibody tests were very high positive for the SSA and SSB but negative for any other lupus specific antibodies. Her hemoglobin was 9.1 still has mild anemia. I recommend increasing the azathioprine  to 100 mg daily and have sent a new prescription for this.  I also recommend she add a low-dose of steroids with prednisone  5 mg once daily.

## 2023-05-04 NOTE — Telephone Encounter (Signed)
Patient advised in result note.

## 2023-05-04 NOTE — Telephone Encounter (Signed)
Now addressed in associated result note

## 2023-05-04 NOTE — Telephone Encounter (Signed)
Opened in error

## 2023-05-04 NOTE — Addendum Note (Signed)
Addended by: Fuller Plan on: 05/04/2023 09:05 AM   Modules accepted: Orders

## 2023-05-12 ENCOUNTER — Ambulatory Visit: Payer: Managed Care, Other (non HMO) | Admitting: Internal Medicine

## 2023-07-03 ENCOUNTER — Other Ambulatory Visit: Payer: Self-pay | Admitting: Internal Medicine

## 2023-07-03 DIAGNOSIS — M329 Systemic lupus erythematosus, unspecified: Secondary | ICD-10-CM

## 2023-07-03 DIAGNOSIS — M350C Sjogren syndrome with dental involvement: Secondary | ICD-10-CM

## 2023-07-04 NOTE — Telephone Encounter (Signed)
 Last Fill: 05/04/2023  Next Visit: 07/25/2023  Last Visit: 04/27/2023  Dx: Systemic lupus erythematosus, unspecified SLE type, unspecified organ involvement status   Current Dose per office note on 04/27/2023: prednisone 5 mg once daily.   Okay to refill Prednisone?

## 2023-07-13 NOTE — Progress Notes (Signed)
 Office Visit Note  Patient: Connie Foster             Date of Birth: [birth year]           MRN: [id number]             PCP: Alona Arrow, PA Referring: Alona Arrow, PA Visit Date: 07/25/2023   Subjective:  Follow-up    Discussed the use of AI scribe software for clinical note transcription with the patient, who gave verbal consent to proceed.  History of Present Illness   Connie Foster is a 43 y.o. female here for follow up for history of systemic lupus with fatigue, joint pain, abnormal liver function, and leukopenia now on hydroxychloroquine  400 mg daily and started azathioprine  100 mg daily.   Her skin flare-ups have improved significantly since her last visit in February. She experienced rashes at that time, but they cleared up approximately three weeks later and have not recurred since. She is currently on Imuran  and a low dose of prednisone , which have helped manage her symptoms. No new rashes or skin issues are present.  She experiences daily dizziness around 4 or 5 PM, which she manages by sitting down. No other side effects from her medications have been noted.  She describes a recurring issue with her throat, feeling as if something is cutting off her air, making it difficult to breathe deeply. This sensation is accompanied by headaches and is constant when it occurs. Previous evaluations, including an x-ray and endoscopy, did not reveal any abnormalities. She has not had a laryngoscopy. No history of asthma is present, and a pulmonologist evaluation two to three years ago showed no significant findings.  No recent illnesses and no joint pain, except for some wear and tear in her knees. No new dark spots on her skin are present, and the existing ones are remnants from previous flare-ups.       Previous HPI 04/27/2023 Connie Foster is a 43 y.o. female here for follow up for history of systemic lupus with fatigue, joint pain, abnormal liver function,  and leukopenia now on hydroxychloroquine  400 mg daily and started azathioprine  50 mg daily.     She is following up for her autoimmune condition, which has been associated with skin flares. Since starting azathioprine  at one pill a day, some of her rashes have improved, but she has developed new bruises. These bruises are distinct from her usual rashes and are not preceded by hives. No history of easy bruising prior to this.   She is experiencing a rash on the bottom of her foot, which has been painful and made walking difficult. The rash is currently healing but was described as 'horrible' and 'huge', with skin peeling off and remaining red along the border. She continues to use an ointment for this.   In December, she experienced a severe episode of chest pain and difficulty breathing, described as the worst pain she has ever experienced. The pain started in her lower back, radiated over her shoulder, and into her chest. It was exacerbated by lying down, forcing her to sleep sitting up for two weeks. She visited the ER, where various tests were conducted, and her primary care physician prescribed meloxicam, which helped alleviate the pain. She also received an injection of toradol IM that significantly improved her symptoms. She has not taken any steroids since her last visit.      Previous HPI 02/23/2023 Connie Foster is a 43 y.o. female here for follow up for  history of systemic lupus with fatigue, joint pain, abnormal liver function, and leukopenia now on hydroxychloroquine  400 mg daily.  She presents with a chief complaint of recurrent, widespread rashes that have been progressively worsening over the past month. The rashes, which are initially painful and subsequently become itchy, are located on the arms, legs, face, and ears. The patient reports that the individual spots last for approximately two weeks when treated with their son's eczema cream, but prior to this, the spots would persist for  months.   The patient has been taking hydroxychloroquine  for an unspecified condition, and despite this, the rashes have continued to appear. The patient denies any recent illnesses and reports that this is a new symptom. They have not experienced rashes like this before.   The patient also reports persistent soreness in an unspecified area and swollen lymph nodes. Despite these symptoms, the patient reports feeling generally well and has been able to receive their flu and COVID vaccinations without any adverse reactions.      Previous HPI 07/27/2022 Connie Foster is a 43 y.o. female here for follow up for history of systemic lupus with fatigue, joint pain, abnormal liver function, and leukopenia now on hydroxychloroquine  400 mg daily.  Overall she feels pretty well still has dry eyes and mouth, fatigue, and joint stiffness but symptoms are manageable.  She is using oral lozenges or gum and staying well-hydrated for dry mouth.  Not requiring regular use of OTC anti-inflammatories for joint pain.  She has not experienced any interval infections or complications. Continues to have tenderness and swelling around the neck and base of the jaw.   Previous HPI 04/28/22 Connie Foster is a 43 y.o. female here for follow up for lupus now on hydroxychloroquine  400 mg daily.  Since her last visit she had the liver biopsy obtained and findings were benign with no inflammatory changes identified.  She subsequently started on hydroxychloroquine  initially at 200 and then at 400 she has noticed an improvement with fatigue and urinary frequency, partial improvement with stiffness.  She did not notice any side effects with starting the medicine though for the past few weeks has had decreased appetite and for the past 1 week is experiencing frequent heart palpitations.  These last for only a few seconds at a time but are pretty consistently occurring every day. She does not recall similar symptoms in the past. Not  associated with any chest pain, dizziness, lightheadedness, shortness of breath, or leg swelling.   Previous HPI 01/21/22 Connie Foster is a 43 y.o. female here for follow up for history of lupus with recent onset of more symptoms and also getting evaluation for possible autoimmune hepatitis.  Laboratory testing at our visit was negative for lupus specific antibody markers but did have elevated sedimentation rate of 108.  Also had IgG serum level checked after that which was over 4500.  Overall findings were concerning and has been scheduled for planned liver biopsy next week.  Her symptoms remain basically the same without any change in the past few weeks.   Previous HPI 12/25/2021 Mannat Waldridge is a 43 y.o. female here for history of lupus.  This original diagnosis was 14 years ago on the basis of abnormal lab findings but did not have signs or symptomatic complaints at the time so was never started on any treatment.  She was not seeing a rheumatologist at the time.  More recently had some repeat work-up being evaluated due to feeling worse with new symptoms since  about 4 months ago.  This includes evaluation in gastroenterology clinic with elevated liver enzymes which are new.  She has new problems with headache and vertigo symptoms.  Left hip pain is a more chronic problem but has been increased more than usual.  She is experiencing constipation usually without severe abdominal pain or other GI issues.  She does describe a sensation of dry mouth and increasing urinary frequency compared to her usual.  Overall exertion tolerance is worse than before.  For the past 1 month symptoms have been bad enough that she is stopped doing any routine exercise she previously had been very regular with multiple times per week aerobic exercises.  She had a skin rash on the shins recently that was treated with topical prescription medication and resolves before now.  She denies any issues with oral or nasal ulcers,  lymphadenopathy, Raynaud's symptoms, or photosensitive skin rashes.   Labs reviewed 12/2021 ANA 1:1280 fine speckled Smooth muscle Ab neg Mitochondrial Ab neg Ttg IgA neg   10/2021 CBC WBC 3.8 Hgb 9.6 AST 100 ALT 98 SPEP polyclonal gamma globulin increase HAV Ab pos HBV sAb pos HBV sAg neg   Review of Systems  Constitutional:  Positive for fatigue.  HENT:  Positive for mouth dryness. Negative for mouth sores.   Eyes:  Negative for dryness.  Respiratory:  Negative for shortness of breath.   Cardiovascular:  Negative for chest pain and palpitations.  Gastrointestinal:  Negative for blood in stool, constipation and diarrhea.  Endocrine: Negative for increased urination.  Genitourinary:  Negative for involuntary urination.  Musculoskeletal:  Positive for morning stiffness. Negative for joint pain, gait problem, joint pain, joint swelling, myalgias, muscle weakness, muscle tenderness and myalgias.  Skin:  Negative for color change, rash, hair loss and sensitivity to sunlight.  Allergic/Immunologic: Negative for susceptible to infections.  Neurological:  Negative for dizziness and headaches.  Hematological:  Negative for swollen glands.  Psychiatric/Behavioral:  Positive for sleep disturbance. Negative for depressed mood. The patient is not nervous/anxious.     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   Anemia 04/28/2022   Palpitations 04/28/2022   Systemic lupus erythematosus (HCC) 12/25/2021   Vertigo 12/25/2021   Pain in left hip 12/25/2021   Liver function test abnormality 12/25/2021   Urinary frequency 12/25/2021   Lung nodule    Dyspnea 03/12/2015   Hypokalemia 03/12/2015   Reactive airway disease with wheezing with acute exacerbation 03/12/2015    Past Medical History:  Diagnosis Date   History of hypertension    Hypokalemia    Systemic lupus erythematosus (HCC)      Family History  Problem Relation Age of Onset   Hypertension Mother    Allergies Mother    Diabetes Mother    Healthy Father    Thyroid  disease Brother    Colon cancer Maternal Uncle    Breast cancer Maternal Grandmother    Healthy Son    Esophageal cancer Neg Hx    Stomach cancer Neg Hx    Rectal cancer Neg Hx    Past Surgical History:  Procedure Laterality Date   CESAREAN SECTION     SLEEVE GASTROPLASTY     TONSILLECTOMY     Social History   Social History Narrative   Not on file   Immunization History  Administered Date(s) Administered   Fluzone Influenza virus vaccine,trivalent (IIV3), split virus 04/26/2023  Influenza,inj,Quad PF,6+ Mos 12/19/2018   Influenza-Unspecified 12/22/2020   Moderna SARS-COV2 Booster Vaccination 12/24/2020   Moderna Sars-Covid-2 Vaccination 06/10/2019, 07/08/2019, 02/24/2020   Pfizer Covid-19 Vaccine Bivalent Booster 79yrs & up 12/22/2020   Tdap 12/19/2018     Objective: Vital Signs: BP 130/84 (BP Location: Left Arm, Patient Position: Sitting, Cuff Size: Normal)   Pulse 83   Resp 16   Ht 5\' 5"  (1.651 m)   Wt 217 lb 9.6 oz (98.7 kg)   BMI 36.21 kg/m    Physical Exam Eyes:     Conjunctiva/sclera: Conjunctivae normal.  Cardiovascular:     Rate and Rhythm: Normal rate and regular rhythm.  Pulmonary:     Effort: Pulmonary effort is normal.     Breath sounds: Normal breath sounds.  Lymphadenopathy:     Cervical: Cervical adenopathy (nontender) present.  Skin:    General: Skin is warm and dry.  Neurological:     Mental Status: She is alert.  Psychiatric:        Mood and Affect: Mood normal.      Musculoskeletal Exam:  Shoulders full ROM no tenderness or swelling Elbows full ROM no tenderness or swelling Wrists full ROM no tenderness or swelling Fingers full ROM no tenderness or swelling Knees full ROM no tenderness or swelling   Investigation: No additional findings.  Imaging: No results found.  Recent  Labs: Lab Results  Component Value Date   WBC 5.3 07/25/2023   HGB 9.4 (L) 07/25/2023   PLT 369 07/25/2023   NA 134 (L) 07/25/2023   K 3.9 07/25/2023   CL 106 07/25/2023   CO2 24 07/25/2023   GLUCOSE 83 07/25/2023   BUN 18 07/25/2023   CREATININE 0.85 07/25/2023   BILITOT 0.5 07/25/2023   ALKPHOS 61 01/29/2022   AST 20 07/25/2023   ALT 8 07/25/2023   PROT 9.9 (H) 07/25/2023   ALBUMIN 3.3 (L) 01/29/2022   CALCIUM 9.4 07/25/2023   GFRAA >60 03/13/2015    Speciality Comments: PLQ EYE EXAM 03/29/2022 Normal [facility] 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 Possible airway involvement with difficulty breathing and headaches, likely due to angioedema or airway spasms. ENT evaluation recommended to rule out structural or inflammatory causes. Discussed potential for airway involvement leading to angioedema and anaphylaxis. - Continue Imuran  regimen. - Consider tapering prednisone  if lab results and symptoms are stable. - Refer to ENT specialist for laryngoscopy to evaluate airway symptoms.  High risk medication use - Increase azathioprine  to 100 mg daily, hydroxychloroquine  400 mg daily. PLQ EYE EXAM 03/29/2022 Normal. Needs updated PLQ eye exam. - Plan: CBC with Differential/Platelet, Comprehensive metabolic panel with GFR - Checking CBC and CMP for medication monitoring and for evidence of SLE activity  Urticarial vasculitis - triamcinolone  ointment (KENALOG ) 0.5 %  Lymphadenopathy Persistent lymphadenopathy, possibly related to underlying inflammatory process. Lymph nodes palpable, indicating possible ongoing inflammation or drainage.  Dizziness Daily dizziness possibly related to medication side  effects.   Orders: Orders Placed This Encounter  Procedures   Sedimentation rate   C3 and C4   CBC with Differential/Platelet   Comprehensive metabolic panel with GFR   No orders of the defined types were placed in this encounter.    Follow-Up Instructions: No follow-ups on file.  Matt Song, MD  Note - This record has been created using [employer].  Chart creation errors have been sought, but may not always  have been located. Such creation errors do not reflect on  the standard of medical care.

## 2023-07-20 ENCOUNTER — Other Ambulatory Visit: Payer: Self-pay | Admitting: Internal Medicine

## 2023-07-20 DIAGNOSIS — M350C Sjogren syndrome with dental involvement: Secondary | ICD-10-CM

## 2023-07-20 NOTE — Telephone Encounter (Signed)
 Last Fill: 05/13/2023  Labs: 04/27/2023 Sed rate is increased up to 104.  Complement C4 is decreased down to 11.  Serum immunoglobulin and total proteins are increased.  This is overall consistent with increased inflammation activity.  Her antibody tests were very high positive for the SSA and SSB but negative for any other lupus specific antibodies. Her hemoglobin was 9.1 still has mild anemia. I recommend increasing the azathioprine  to 100 mg daily and have sent a new prescription for this.  I also recommend she add a low-dose of steroids with prednisone  5 mg once daily.  Next Visit: 07/25/2023  Last Visit: 04/27/2023  DX: Systemic lupus erythematosus, unspecified SLE type, unspecified organ involvement status (HCC)   Current Dose per office note 04/27/2023: Increase azathioprine  to 100 mg daily   Okay to refill Imuran ?

## 2023-07-25 ENCOUNTER — Ambulatory Visit: Payer: BC Managed Care – PPO | Attending: Internal Medicine | Admitting: Internal Medicine

## 2023-07-25 ENCOUNTER — Encounter: Payer: Self-pay | Admitting: Internal Medicine

## 2023-07-25 VITALS — BP 130/84 | HR 83 | Resp 16 | Ht 65.0 in | Wt 217.6 lb

## 2023-07-25 DIAGNOSIS — L958 Other vasculitis limited to the skin: Secondary | ICD-10-CM

## 2023-07-25 DIAGNOSIS — Z79899 Other long term (current) drug therapy: Secondary | ICD-10-CM | POA: Diagnosis not present

## 2023-07-25 DIAGNOSIS — M329 Systemic lupus erythematosus, unspecified: Secondary | ICD-10-CM

## 2023-07-25 DIAGNOSIS — R091 Pleurisy: Secondary | ICD-10-CM

## 2023-07-26 LAB — CBC WITH DIFFERENTIAL/PLATELET
Absolute Lymphocytes: 1039 {cells}/uL (ref 850–3900)
Absolute Monocytes: 604 {cells}/uL (ref 200–950)
Basophils Absolute: 32 {cells}/uL (ref 0–200)
Basophils Relative: 0.6 %
Eosinophils Absolute: 58 {cells}/uL (ref 15–500)
Eosinophils Relative: 1.1 %
HCT: 30.1 % — ABNORMAL LOW (ref 35.0–45.0)
Hemoglobin: 9.4 g/dL — ABNORMAL LOW (ref 11.7–15.5)
MCH: 25.1 pg — ABNORMAL LOW (ref 27.0–33.0)
MCHC: 31.2 g/dL — ABNORMAL LOW (ref 32.0–36.0)
MCV: 80.3 fL (ref 80.0–100.0)
MPV: 10.2 fL (ref 7.5–12.5)
Monocytes Relative: 11.4 %
Neutro Abs: 3567 {cells}/uL (ref 1500–7800)
Neutrophils Relative %: 67.3 %
Platelets: 369 10*3/uL (ref 140–400)
RBC: 3.75 10*6/uL — ABNORMAL LOW (ref 3.80–5.10)
RDW: 14.1 % (ref 11.0–15.0)
Total Lymphocyte: 19.6 %
WBC: 5.3 10*3/uL (ref 3.8–10.8)

## 2023-07-26 LAB — COMPREHENSIVE METABOLIC PANEL WITH GFR
AG Ratio: 0.6 (calc) — ABNORMAL LOW (ref 1.0–2.5)
ALT: 8 U/L (ref 6–29)
AST: 20 U/L (ref 10–30)
Albumin: 3.8 g/dL (ref 3.6–5.1)
Alkaline phosphatase (APISO): 65 U/L (ref 31–125)
BUN: 18 mg/dL (ref 7–25)
CO2: 24 mmol/L (ref 20–32)
Calcium: 9.4 mg/dL (ref 8.6–10.2)
Chloride: 106 mmol/L (ref 98–110)
Creat: 0.85 mg/dL (ref 0.50–0.99)
Globulin: 6.1 g/dL — ABNORMAL HIGH (ref 1.9–3.7)
Glucose, Bld: 83 mg/dL (ref 65–99)
Potassium: 3.9 mmol/L (ref 3.5–5.3)
Sodium: 134 mmol/L — ABNORMAL LOW (ref 135–146)
Total Bilirubin: 0.5 mg/dL (ref 0.2–1.2)
Total Protein: 9.9 g/dL — ABNORMAL HIGH (ref 6.1–8.1)
eGFR: 87 mL/min/{1.73_m2} (ref >=60)

## 2023-07-26 LAB — C3 AND C4
C3 Complement: 107 mg/dL (ref 83–193)
C4 Complement: 10 mg/dL — ABNORMAL LOW (ref 15–57)

## 2023-07-26 LAB — SEDIMENTATION RATE: Sed Rate: 82 mm/h — ABNORMAL HIGH (ref 0–20)

## 2023-08-09 DIAGNOSIS — Z30433 Encounter for removal and reinsertion of intrauterine contraceptive device: Secondary | ICD-10-CM | POA: Diagnosis not present

## 2023-08-09 DIAGNOSIS — Z3202 Encounter for pregnancy test, result negative: Secondary | ICD-10-CM | POA: Diagnosis not present

## 2023-08-09 DIAGNOSIS — Z30430 Encounter for insertion of intrauterine contraceptive device: Secondary | ICD-10-CM | POA: Diagnosis not present

## 2023-09-23 ENCOUNTER — Other Ambulatory Visit: Payer: Self-pay | Admitting: Internal Medicine

## 2023-09-23 DIAGNOSIS — M350C Sjogren syndrome with dental involvement: Secondary | ICD-10-CM

## 2023-09-26 ENCOUNTER — Ambulatory Visit: Payer: Self-pay | Admitting: Internal Medicine

## 2023-09-26 NOTE — Progress Notes (Signed)
 Sed rate of 82 remains elevated partially improved from 104 last time.  Complement C4 remains low at 10.  Her hemoglobin remains slightly low at 9.4.  Total protein and serum immunoglobulins are also still high.  Overall this indicates some ongoing inflammation it would not recommend any reduction in her medication right now.

## 2023-09-26 NOTE — Telephone Encounter (Signed)
 Last Fill: 02/23/2024  Eye exam: 03/29/2022 Normal   LMOM for patient to contact the office about eye exam.    Labs: 07/25/2023 Sodium  134 Total Protein 9.9 Globulin 6.1 AG Ration 0.6  RBC 3.75 Hemoglobin 9.4 HCT 30.1 MCH 25.1 MCHC 31.2  Next Visit: 10/27/2023  Last Visit: 07/25/2023  DX: Systemic lupus   Current Dose per office note 07/25/2023: hydroxychloroquine  400 mg daily   Okay to refill Plaquenil ?

## 2023-10-20 NOTE — Progress Notes (Deleted)
 Office Visit Note  Patient: Connie Foster             Date of Birth: 30-Nov-1980           MRN: [id number]             PCP: Stacia Millman, PA Referring: Stacia Millman, PA Visit Date: 10/27/2023   Subjective:  No chief complaint on file.   History of Present Illness: Connie Foster is a 43 y.o. female here for follow up for history of systemic lupus with fatigue, joint pain, abnormal liver function, and leukopenia now on hydroxychloroquine  400 mg daily and started azathioprine  100 mg daily.    Previous HPI 07/25/2023 Connie Foster is a 43 y.o. female here for follow up for history of systemic lupus with fatigue, joint pain, abnormal liver function, and leukopenia now on hydroxychloroquine  400 mg daily and started azathioprine  100 mg daily.    Her skin flare-ups have improved significantly since her last visit in February. She experienced rashes at that time, but they cleared up approximately three weeks later and have not recurred since. She is currently on Imuran  and a low dose of prednisone , which have helped manage her symptoms. No new rashes or skin issues are present.   She experiences daily dizziness around 4 or 5 PM, which she manages by sitting down. No other side effects from her medications have been noted.   She describes a recurring issue with her throat, feeling as if something is cutting off her air, making it difficult to breathe deeply. This sensation is accompanied by headaches and is constant when it occurs. Previous evaluations, including an x-ray and endoscopy, did not reveal any abnormalities. She has not had a laryngoscopy. No history of asthma is present, and a pulmonologist evaluation two to three years ago showed no significant findings.   No recent illnesses and no joint pain, except for some wear and tear in her knees. No new dark spots on her skin are present, and the existing ones are remnants from previous flare-ups.         Previous  HPI 04/27/2023 Connie Foster is a 43 y.o. female here for follow up for history of systemic lupus with fatigue, joint pain, abnormal liver function, and leukopenia now on hydroxychloroquine  400 mg daily and started azathioprine  50 mg daily.     She is following up for her autoimmune condition, which has been associated with skin flares. Since starting azathioprine  at one pill a day, some of her rashes have improved, but she has developed new bruises. These bruises are distinct from her usual rashes and are not preceded by hives. No history of easy bruising prior to this.   She is experiencing a rash on the bottom of her foot, which has been painful and made walking difficult. The rash is currently healing but was described as 'horrible' and 'huge', with skin peeling off and remaining red along the border. She continues to use an ointment for this.   In December, she experienced a severe episode of chest pain and difficulty breathing, described as the worst pain she has ever experienced. The pain started in her lower back, radiated over her shoulder, and into her chest. It was exacerbated by lying down, forcing her to sleep sitting up for two weeks. She visited the ER, where various tests were conducted, and her primary care physician prescribed meloxicam, which helped alleviate the pain. She also received an injection of toradol IM that significantly improved her symptoms. She  has not taken any steroids since her last visit.      Previous HPI 02/23/2023 Connie Foster is a 43 y.o. female here for follow up for history of systemic lupus with fatigue, joint pain, abnormal liver function, and leukopenia now on hydroxychloroquine  400 mg daily.  She presents with a chief complaint of recurrent, widespread rashes that have been progressively worsening over the past month. The rashes, which are initially painful and subsequently become itchy, are located on the arms, legs, face, and ears. The patient  reports that the individual spots last for approximately two weeks when treated with their son's eczema cream, but prior to this, the spots would persist for months.   The patient has been taking hydroxychloroquine  for an unspecified condition, and despite this, the rashes have continued to appear. The patient denies any recent illnesses and reports that this is a new symptom. They have not experienced rashes like this before.   The patient also reports persistent soreness in an unspecified area and swollen lymph nodes. Despite these symptoms, the patient reports feeling generally well and has been able to receive their flu and COVID vaccinations without any adverse reactions.      Previous HPI 07/27/2022 Connie Foster is a 43 y.o. female here for follow up for history of systemic lupus with fatigue, joint pain, abnormal liver function, and leukopenia now on hydroxychloroquine  400 mg daily.  Overall she feels pretty well still has dry eyes and mouth, fatigue, and joint stiffness but symptoms are manageable.  She is using oral lozenges or gum and staying well-hydrated for dry mouth.  Not requiring regular use of OTC anti-inflammatories for joint pain.  She has not experienced any interval infections or complications. Continues to have tenderness and swelling around the neck and base of the jaw.   Previous HPI 04/28/22 Connie Foster is a 43 y.o. female here for follow up for lupus now on hydroxychloroquine  400 mg daily.  Since her last visit she had the liver biopsy obtained and findings were benign with no inflammatory changes identified.  She subsequently started on hydroxychloroquine  initially at 200 and then at 400 she has noticed an improvement with fatigue and urinary frequency, partial improvement with stiffness.  She did not notice any side effects with starting the medicine though for the past few weeks has had decreased appetite and for the past 1 week is experiencing frequent heart  palpitations.  These last for only a few seconds at a time but are pretty consistently occurring every day. She does not recall similar symptoms in the past. Not associated with any chest pain, dizziness, lightheadedness, shortness of breath, or leg swelling.   Previous HPI 01/21/22 Connie Foster is a 43 y.o. female here for follow up for history of lupus with recent onset of more symptoms and also getting evaluation for possible autoimmune hepatitis.  Laboratory testing at our visit was negative for lupus specific antibody markers but did have elevated sedimentation rate of 108.  Also had IgG serum level checked after that which was over 4500.  Overall findings were concerning and has been scheduled for planned liver biopsy next week.  Her symptoms remain basically the same without any change in the past few weeks.   Previous HPI 12/25/2021 Connie Foster is a 43 y.o. female here for history of lupus.  This original diagnosis was 14 years ago on the basis of abnormal lab findings but did not have signs or symptomatic complaints at the time so was never started on  any treatment.  She was not seeing a rheumatologist at the time.  More recently had some repeat work-up being evaluated due to feeling worse with new symptoms since about 4 months ago.  This includes evaluation in gastroenterology clinic with elevated liver enzymes which are new.  She has new problems with headache and vertigo symptoms.  Left hip pain is a more chronic problem but has been increased more than usual.  She is experiencing constipation usually without severe abdominal pain or other GI issues.  She does describe a sensation of dry mouth and increasing urinary frequency compared to her usual.  Overall exertion tolerance is worse than before.  For the past 1 month symptoms have been bad enough that she is stopped doing any routine exercise she previously had been very regular with multiple times per week aerobic exercises.  She had  a skin rash on the shins recently that was treated with topical prescription medication and resolves before now.  She denies any issues with oral or nasal ulcers, lymphadenopathy, Raynaud's symptoms, or photosensitive skin rashes.   Labs reviewed 12/2021 ANA 1:1280 fine speckled Smooth muscle Ab neg Mitochondrial Ab neg Ttg IgA neg   10/2021 CBC WBC 3.8 Hgb 9.6 AST 100 ALT 98 SPEP polyclonal gamma globulin increase HAV Ab pos HBV sAb pos HBV sAg neg   No Rheumatology ROS completed.   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   Anemia 04/28/2022   Palpitations 04/28/2022   Systemic lupus erythematosus (HCC) 12/25/2021   Vertigo 12/25/2021   Pain in left hip 12/25/2021   Liver function test abnormality 12/25/2021   Urinary frequency 12/25/2021   Lung nodule    Dyspnea 03/12/2015   Hypokalemia 03/12/2015   Reactive airway disease with wheezing with acute exacerbation 03/12/2015    Past Medical History:  Diagnosis Date   History of hypertension    Hypokalemia    Systemic lupus erythematosus (HCC)     Family History  Problem Relation Age of Onset   Hypertension Mother    Allergies Mother    Diabetes Mother    Healthy Father    Thyroid  disease Brother    Colon cancer Maternal Uncle    Breast cancer Maternal Grandmother    Healthy Son    Esophageal cancer Neg Hx    Stomach cancer Neg Hx    Rectal cancer Neg Hx    Past Surgical History:  Procedure Laterality Date   CESAREAN SECTION     SLEEVE GASTROPLASTY     TONSILLECTOMY     Social History   Social History Narrative   Not on file   Immunization History  Administered Date(s) Administered   Fluzone Influenza virus vaccine,trivalent (IIV3), split virus 04/26/2023   Influenza,inj,Quad PF,6+ Mos 12/19/2018   Influenza-Unspecified 12/22/2020   Moderna SARS-COV2 Booster Vaccination 12/24/2020   Moderna  Sars-Covid-2 Vaccination 06/10/2019, 07/08/2019, 02/24/2020   Pfizer Covid-19 Vaccine Bivalent Booster 52yrs & up 12/22/2020   Tdap 12/19/2018     Objective: Vital Signs: There were no vitals taken for this visit.   Physical Exam   Musculoskeletal Exam: ***  CDAI Exam: CDAI Score: -- Patient Global: --; Provider Global: -- Swollen: --; Tender: -- Joint Exam 10/27/2023   No joint exam has been documented for this visit   There is currently no information documented on the homunculus. Go to the Rheumatology activity and complete the homunculus joint exam.  Investigation: No additional findings.  Imaging: No results found.  Recent Labs: Lab Results  Component Value Date   WBC 5.3 07/25/2023   HGB 9.4 (L) 07/25/2023   PLT 369 07/25/2023   NA 134 (L) 07/25/2023   K 3.9 07/25/2023   CL 106 07/25/2023   CO2 24 07/25/2023   GLUCOSE 83 07/25/2023   BUN 18 07/25/2023   CREATININE 0.85 07/25/2023   BILITOT 0.5 07/25/2023   ALKPHOS 61 01/29/2022   AST 20 07/25/2023   ALT 8 07/25/2023   PROT 9.9 (H) 07/25/2023   ALBUMIN 3.3 (L) 01/29/2022   CALCIUM 9.4 07/25/2023   GFRAA >60 03/13/2015    Speciality Comments: PLQ EYE EXAM 03/29/2022 Normal [facility] f/u 5 years  Procedures:  No procedures performed Allergies: Lisinopril   Assessment / Plan:     Visit Diagnoses: No diagnosis found.  ***  Orders: No orders of the defined types were placed in this encounter.  No orders of the defined types were placed in this encounter.    Follow-Up Instructions: No follow-ups on file.   Connie Foster, RT  Note - This record has been created using [employer].  Chart creation errors have been sought, but may not always  have been located. Such creation errors do not reflect on  the standard of medical care.

## 2023-10-22 ENCOUNTER — Other Ambulatory Visit: Payer: Self-pay | Admitting: Internal Medicine

## 2023-10-22 DIAGNOSIS — M350C Sjogren syndrome with dental involvement: Secondary | ICD-10-CM

## 2023-10-22 DIAGNOSIS — Z79899 Other long term (current) drug therapy: Secondary | ICD-10-CM

## 2023-10-24 NOTE — Telephone Encounter (Signed)
 Last Fill: 07/22/2023  Labs: 07/25/2023 Sed rate of 82 remains elevated partially improved from 104 last time. Complement C4 remains low at 10. Her hemoglobin remains slightly low at 9.4. Total protein and serum immunoglobulins are also still high. Overall this indicates some ongoing inflammation it would not recommend any reduction in her medication right now.   Next Visit: 10/26/2023  Last Visit: 07/25/2023  DX: Systemic lupus erythematosus, unspecified SLE type, unspecified organ involvement status (HCC)   Current Dose per office note 07/25/2023: Increase azathioprine  to 100 mg daily   Patient to update labs at upcomming appointment.   Okay to refill Imuran ?

## 2023-10-25 NOTE — Progress Notes (Signed)
 Office Visit Note  Patient: Connie Foster             Date of Birth: 1980-03-28           MRN: [id number]             PCP: Stacia Millman, PA Referring: Stacia Millman, PA Visit Date: 10/26/2023   Subjective:  Follow-up   Discussed the use of AI scribe software for clinical note transcription with the patient, who gave verbal consent to proceed.  History of Present Illness   Connie Foster is a 43 y.o. female here for follow up for history of systemic lupus with fatigue, joint pain, abnormal liver function, and leukopenia now on hydroxychloroquine  400 mg daily and started azathioprine  100 mg daily.  She experiences sharp chest and back pain that occurs almost like clockwork every few months, lasting for four to five days. The pain is exacerbated by deep breathing and lying down, forcing her to sit up. She describes the pain as 'miserable' and notes that it significantly impacts her comfort during these episodes.  She was previously on prednisone  but has not taken it for a month. There has been no significant change in symptoms since stopping prednisone . No recent respiratory infections or need for antibiotics.  She has noticed new itchy rashes on her arms over the past week, which she associates with being off steroids. She also reports bruising on her arms without any known injury.  No recent mouth or nose ulcers, respiratory infections, or gastrointestinal symptoms like nausea or diarrhea. She reports persistent throat tightness but no swelling in her hands or feet.      Previous HPI 07/25/2023 Connie Foster is a 43 y.o. female here for follow up for history of systemic lupus with fatigue, joint pain, abnormal liver function, and leukopenia now on hydroxychloroquine  400 mg daily and started azathioprine  100 mg daily.    Her skin flare-ups have improved significantly since her last visit in February. She experienced rashes at that time, but they cleared up  approximately three weeks later and have not recurred since. She is currently on Imuran  and a low dose of prednisone , which have helped manage her symptoms. No new rashes or skin issues are present.   She experiences daily dizziness around 4 or 5 PM, which she manages by sitting down. No other side effects from her medications have been noted.   She describes a recurring issue with her throat, feeling as if something is cutting off her air, making it difficult to breathe deeply. This sensation is accompanied by headaches and is constant when it occurs. Previous evaluations, including an x-ray and endoscopy, did not reveal any abnormalities. She has not had a laryngoscopy. No history of asthma is present, and a pulmonologist evaluation two to three years ago showed no significant findings.   No recent illnesses and no joint pain, except for some wear and tear in her knees. No new dark spots on her skin are present, and the existing ones are remnants from previous flare-ups.         Previous HPI 04/27/2023 Connie Foster is a 43 y.o. female here for follow up for history of systemic lupus with fatigue, joint pain, abnormal liver function, and leukopenia now on hydroxychloroquine  400 mg daily and started azathioprine  50 mg daily.     She is following up for her autoimmune condition, which has been associated with skin flares. Since starting azathioprine  at one pill a day, some of her rashes have  improved, but she has developed new bruises. These bruises are distinct from her usual rashes and are not preceded by hives. No history of easy bruising prior to this.   She is experiencing a rash on the bottom of her foot, which has been painful and made walking difficult. The rash is currently healing but was described as 'horrible' and 'huge', with skin peeling off and remaining red along the border. She continues to use an ointment for this.   In December, she experienced a severe episode of chest pain  and difficulty breathing, described as the worst pain she has ever experienced. The pain started in her lower back, radiated over her shoulder, and into her chest. It was exacerbated by lying down, forcing her to sleep sitting up for two weeks. She visited the ER, where various tests were conducted, and her primary care physician prescribed meloxicam, which helped alleviate the pain. She also received an injection of toradol IM that significantly improved her symptoms. She has not taken any steroids since her last visit.      Previous HPI 02/23/2023 Connie Foster is a 43 y.o. female here for follow up for history of systemic lupus with fatigue, joint pain, abnormal liver function, and leukopenia now on hydroxychloroquine  400 mg daily.  She presents with a chief complaint of recurrent, widespread rashes that have been progressively worsening over the past month. The rashes, which are initially painful and subsequently become itchy, are located on the arms, legs, face, and ears. The patient reports that the individual spots last for approximately two weeks when treated with their son's eczema cream, but prior to this, the spots would persist for months.   The patient has been taking hydroxychloroquine  for an unspecified condition, and despite this, the rashes have continued to appear. The patient denies any recent illnesses and reports that this is a new symptom. They have not experienced rashes like this before.   The patient also reports persistent soreness in an unspecified area and swollen lymph nodes. Despite these symptoms, the patient reports feeling generally well and has been able to receive their flu and COVID vaccinations without any adverse reactions.      Previous HPI 07/27/2022 Connie Foster is a 43 y.o. female here for follow up for history of systemic lupus with fatigue, joint pain, abnormal liver function, and leukopenia now on hydroxychloroquine  400 mg daily.  Overall she feels  pretty well still has dry eyes and mouth, fatigue, and joint stiffness but symptoms are manageable.  She is using oral lozenges or gum and staying well-hydrated for dry mouth.  Not requiring regular use of OTC anti-inflammatories for joint pain.  She has not experienced any interval infections or complications. Continues to have tenderness and swelling around the neck and base of the jaw.   Previous HPI 04/28/22 Connie Foster is a 42 y.o. female here for follow up for lupus now on hydroxychloroquine  400 mg daily.  Since her last visit she had the liver biopsy obtained and findings were benign with no inflammatory changes identified.  She subsequently started on hydroxychloroquine  initially at 200 and then at 400 she has noticed an improvement with fatigue and urinary frequency, partial improvement with stiffness.  She did not notice any side effects with starting the medicine though for the past few weeks has had decreased appetite and for the past 1 week is experiencing frequent heart palpitations.  These last for only a few seconds at a time but are pretty consistently occurring every day.  She does not recall similar symptoms in the past. Not associated with any chest pain, dizziness, lightheadedness, shortness of breath, or leg swelling.   Previous HPI 01/21/22 Connie Foster is a 43 y.o. female here for follow up for history of lupus with recent onset of more symptoms and also getting evaluation for possible autoimmune hepatitis.  Laboratory testing at our visit was negative for lupus specific antibody markers but did have elevated sedimentation rate of 108.  Also had IgG serum level checked after that which was over 4500.  Overall findings were concerning and has been scheduled for planned liver biopsy next week.  Her symptoms remain basically the same without any change in the past few weeks.   Previous HPI 12/25/2021 Connie Foster is a 43 y.o. female here for history of lupus.  This  original diagnosis was 14 years ago on the basis of abnormal lab findings but did not have signs or symptomatic complaints at the time so was never started on any treatment.  She was not seeing a rheumatologist at the time.  More recently had some repeat work-up being evaluated due to feeling worse with new symptoms since about 4 months ago.  This includes evaluation in gastroenterology clinic with elevated liver enzymes which are new.  She has new problems with headache and vertigo symptoms.  Left hip pain is a more chronic problem but has been increased more than usual.  She is experiencing constipation usually without severe abdominal pain or other GI issues.  She does describe a sensation of dry mouth and increasing urinary frequency compared to her usual.  Overall exertion tolerance is worse than before.  For the past 1 month symptoms have been bad enough that she is stopped doing any routine exercise she previously had been very regular with multiple times per week aerobic exercises.  She had a skin rash on the shins recently that was treated with topical prescription medication and resolves before now.  She denies any issues with oral or nasal ulcers, lymphadenopathy, Raynaud's symptoms, or photosensitive skin rashes.   Labs reviewed 12/2021 ANA 1:1280 fine speckled Smooth muscle Ab neg Mitochondrial Ab neg Ttg IgA neg   10/2021 CBC WBC 3.8 Hgb 9.6 AST 100 ALT 98 SPEP polyclonal gamma globulin increase HAV Ab pos HBV sAb pos HBV sAg neg   Review of Systems  Constitutional:  Positive for fatigue.  HENT:  Positive for mouth dryness. Negative for mouth sores.   Eyes:  Negative for dryness.  Respiratory:  Negative for shortness of breath.   Cardiovascular:  Positive for palpitations. Negative for chest pain.  Gastrointestinal:  Positive for constipation. Negative for blood in stool and diarrhea.  Endocrine: Negative for increased urination.  Genitourinary:  Negative for involuntary  urination.  Musculoskeletal:  Positive for gait problem, myalgias, morning stiffness and myalgias. Negative for joint pain, joint pain, joint swelling, muscle weakness and muscle tenderness.  Skin:  Positive for color change and rash. Negative for hair loss and sensitivity to sunlight.  Allergic/Immunologic: Negative for susceptible to infections.  Neurological:  Positive for dizziness. Negative for headaches.  Hematological:  Negative for swollen glands.  Psychiatric/Behavioral:  Negative for depressed mood and sleep disturbance. The patient is nervous/anxious.     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   Anemia 04/28/2022   Palpitations 04/28/2022   Systemic lupus erythematosus (HCC) 12/25/2021   Vertigo 12/25/2021  Pain in left hip 12/25/2021   Liver function test abnormality 12/25/2021   Urinary frequency 12/25/2021   Lung nodule    Dyspnea 03/12/2015   Hypokalemia 03/12/2015   Reactive airway disease with wheezing with acute exacerbation 03/12/2015    Past Medical History:  Diagnosis Date   History of hypertension    Hypokalemia    Systemic lupus erythematosus (HCC)     Family History  Problem Relation Age of Onset   Hypertension Mother    Allergies Mother    Diabetes Mother    Healthy Father    Thyroid  disease Brother    Colon cancer Maternal Uncle    Breast cancer Maternal Grandmother    Healthy Son    Esophageal cancer Neg Hx    Stomach cancer Neg Hx    Rectal cancer Neg Hx    Past Surgical History:  Procedure Laterality Date   CESAREAN SECTION     SLEEVE GASTROPLASTY     TONSILLECTOMY     Social History   Social History Narrative   Not on file   Immunization History  Administered Date(s) Administered   Fluzone Influenza virus vaccine,trivalent (IIV3), split virus 04/26/2023   Influenza,inj,Quad PF,6+ Mos 12/19/2018   Influenza-Unspecified  12/22/2020   Moderna SARS-COV2 Booster Vaccination 12/24/2020   Moderna Sars-Covid-2 Vaccination 06/01/2019, 06/10/2019, 07/02/2019, 07/08/2019, 02/20/2020, 02/24/2020   Pfizer Covid-19 Vaccine Bivalent Booster 64yrs & up 12/22/2020   Tdap 12/19/2018     Objective: Vital Signs: BP 120/72 (BP Location: Left Arm, Patient Position: Sitting, Cuff Size: Normal)   Pulse 79   Resp 16   Ht 5' 5 (1.651 m)   Wt 198 lb 12.8 oz (90.2 kg)   BMI 33.08 kg/m    Physical Exam Eyes:     Conjunctiva/sclera: Conjunctivae normal.  Cardiovascular:     Rate and Rhythm: Normal rate and regular rhythm.  Pulmonary:     Effort: Pulmonary effort is normal.     Breath sounds: Normal breath sounds.  Lymphadenopathy:     Cervical: No cervical adenopathy.  Skin:    General: Skin is warm and dry.     Comments: Scattered bruises on both arms  Neurological:     Mental Status: She is alert.  Psychiatric:        Mood and Affect: Mood normal.      Musculoskeletal Exam:  Shoulders full ROM no tenderness or swelling Elbows full ROM no tenderness or swelling Wrists full ROM no tenderness or swelling Fingers full ROM no tenderness or swelling No paraspinal tenderness to palpation over upper and lower back Hip normal internal and external rotation without pain, no tenderness to lateral hip palpation Knees full ROM no tenderness or swelling   Investigation: No additional findings.  Imaging: No results found.  Recent Labs: Lab Results  Component Value Date   WBC 4.5 10/26/2023   HGB 9.4 (L) 10/26/2023   PLT 312 10/26/2023   NA 135 10/26/2023   K 3.8 10/26/2023   CL 105 10/26/2023   CO2 23 10/26/2023   GLUCOSE 63 (L) 10/26/2023   BUN 14 10/26/2023   CREATININE 1.04 (H) 10/26/2023   BILITOT 0.7 10/26/2023   ALKPHOS 61 01/29/2022   AST 24 10/26/2023   ALT 9 10/26/2023   PROT 10.7 (H) 10/26/2023   ALBUMIN 3.3 (L) 01/29/2022   CALCIUM 9.4 10/26/2023   GFRAA >60 03/13/2015    Speciality  Comments: PLQ EYE EXAM 03/29/2022 Normal [facility] 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 Increased cutaneous symptoms possibly from prednisone  discontinuation.  The mild bruising to suggest some possible return of the urticarial vasculitis. - Checking sed rate and complements for disease activity monitoring - Continue Plaquenil  400 mg and Imuran  100 mg daily. - Discussed potential treatment options if symptoms persist, including switching Imuran  to methotrexate or Cellcept, or adding Benlysta. Shared decision-making involved discussing the risks and benefits of each - Provide information on methotrexate and Benlysta for review. - Monitor symptoms and consider medication adjustment based on symptom control and lab results.   High risk medication use - Plan: CBC with Differential/Platelet, Comprehensive metabolic panel with GFR Been tolerating medication without specific issue.  No serious interval infections. -Checking CBC and CMP for medication monitoring on continued long-term use of azathioprine    Orders: Orders Placed This Encounter  Procedures   Sedimentation rate   C3 and C4   CBC with Differential/Platelet   Comprehensive metabolic panel with GFR   Protein / creatinine ratio, urine   No orders of the defined types were placed in this encounter.    Follow-Up Instructions: Return in about 3 months (around 01/26/2024) for SLE on HCQ/AZA ?Switch f/u 3mos.   Lonni LELON Ester, MD  Note - This record has been created using [employer].  Chart creation errors have been sought, but may not always  have been located. Such creation  errors do not reflect on  the standard of medical care.

## 2023-10-26 ENCOUNTER — Ambulatory Visit: Attending: Internal Medicine | Admitting: Internal Medicine

## 2023-10-26 ENCOUNTER — Encounter: Payer: Self-pay | Admitting: Internal Medicine

## 2023-10-26 VITALS — BP 120/72 | HR 79 | Resp 16 | Ht 65.0 in | Wt 198.8 lb

## 2023-10-26 DIAGNOSIS — Z79899 Other long term (current) drug therapy: Secondary | ICD-10-CM | POA: Diagnosis not present

## 2023-10-26 DIAGNOSIS — L958 Other vasculitis limited to the skin: Secondary | ICD-10-CM

## 2023-10-26 DIAGNOSIS — M329 Systemic lupus erythematosus, unspecified: Secondary | ICD-10-CM | POA: Diagnosis not present

## 2023-10-27 ENCOUNTER — Ambulatory Visit: Admitting: Internal Medicine

## 2023-10-27 DIAGNOSIS — M329 Systemic lupus erythematosus, unspecified: Secondary | ICD-10-CM

## 2023-10-27 DIAGNOSIS — Z79899 Other long term (current) drug therapy: Secondary | ICD-10-CM

## 2023-10-27 DIAGNOSIS — Z7952 Long term (current) use of systemic steroids: Secondary | ICD-10-CM

## 2023-10-27 DIAGNOSIS — L958 Other vasculitis limited to the skin: Secondary | ICD-10-CM

## 2023-10-27 LAB — C3 AND C4
C3 Complement: 101 mg/dL (ref 83–193)
C4 Complement: 11 mg/dL — ABNORMAL LOW (ref 15–57)

## 2023-10-27 LAB — PROTEIN / CREATININE RATIO, URINE
Creatinine, Urine: 212 mg/dL (ref 20–275)
Protein/Creat Ratio: 113 mg/g{creat} (ref 24–184)
Protein/Creatinine Ratio: 0.113 mg/mg{creat} (ref 0.024–0.184)
Total Protein, Urine: 24 mg/dL (ref 5–24)

## 2023-10-27 LAB — CBC WITH DIFFERENTIAL/PLATELET
Absolute Lymphocytes: 1193 {cells}/uL (ref 850–3900)
Absolute Monocytes: 608 {cells}/uL (ref 200–950)
Basophils Absolute: 18 {cells}/uL (ref 0–200)
Basophils Relative: 0.4 %
Eosinophils Absolute: 50 {cells}/uL (ref 15–500)
Eosinophils Relative: 1.1 %
HCT: 31.1 % — ABNORMAL LOW (ref 35.0–45.0)
Hemoglobin: 9.4 g/dL — ABNORMAL LOW (ref 11.7–15.5)
MCH: 25.2 pg — ABNORMAL LOW (ref 27.0–33.0)
MCHC: 30.2 g/dL — ABNORMAL LOW (ref 32.0–36.0)
MCV: 83.4 fL (ref 80.0–100.0)
MPV: 10.8 fL (ref 7.5–12.5)
Monocytes Relative: 13.5 %
Neutro Abs: 2633 {cells}/uL (ref 1500–7800)
Neutrophils Relative %: 58.5 %
Platelets: 312 Thousand/uL (ref 140–400)
RBC: 3.73 Million/uL — ABNORMAL LOW (ref 3.80–5.10)
RDW: 15.7 % — ABNORMAL HIGH (ref 11.0–15.0)
Total Lymphocyte: 26.5 %
WBC: 4.5 Thousand/uL (ref 3.8–10.8)

## 2023-10-27 LAB — COMPREHENSIVE METABOLIC PANEL WITH GFR
AG Ratio: 0.6 (calc) — ABNORMAL LOW (ref 1.0–2.5)
ALT: 9 U/L (ref 6–29)
AST: 24 U/L (ref 10–35)
Albumin: 3.9 g/dL (ref 3.6–5.1)
Alkaline phosphatase (APISO): 64 U/L (ref 31–125)
BUN/Creatinine Ratio: 13 (calc) (ref 6–22)
BUN: 14 mg/dL (ref 7–25)
CO2: 23 mmol/L (ref 20–32)
Calcium: 9.4 mg/dL (ref 8.6–10.2)
Chloride: 105 mmol/L (ref 98–110)
Creat: 1.04 mg/dL — ABNORMAL HIGH (ref 0.50–0.99)
Globulin: 6.8 g/dL — ABNORMAL HIGH (ref 1.9–3.7)
Glucose, Bld: 63 mg/dL — ABNORMAL LOW (ref 65–99)
Potassium: 3.8 mmol/L (ref 3.5–5.3)
Sodium: 135 mmol/L (ref 135–146)
Total Bilirubin: 0.7 mg/dL (ref 0.2–1.2)
Total Protein: 10.7 g/dL — ABNORMAL HIGH (ref 6.1–8.1)
eGFR: 68 mL/min/1.73m2 (ref >=60)

## 2023-10-27 LAB — SEDIMENTATION RATE: Sed Rate: 89 mm/h — ABNORMAL HIGH (ref 0–20)

## 2023-10-28 DIAGNOSIS — Z0000 Encounter for general adult medical examination without abnormal findings: Secondary | ICD-10-CM | POA: Diagnosis not present

## 2023-10-28 DIAGNOSIS — M329 Systemic lupus erythematosus, unspecified: Secondary | ICD-10-CM | POA: Diagnosis not present

## 2023-11-01 ENCOUNTER — Other Ambulatory Visit: Payer: Self-pay | Admitting: Internal Medicine

## 2023-11-01 DIAGNOSIS — M350C Sjogren syndrome with dental involvement: Secondary | ICD-10-CM

## 2023-11-01 NOTE — Telephone Encounter (Signed)
 Last Fill: 09/26/2023  Eye exam: 03/29/2022 Normal    Contacted patient to advise eye exam needs to be updated. Stated she would make an appointment   Labs: 10/26/2023 Glucose 63 Creat 1.04 Total Protein 10.7 Globulin 6.8 AG Ratio 0.6 RBC 3.73 Hemoglobin 9.4 HCT 31.1 MCH 25.2 MCHC 30.2 RDW 15.7   Next Visit: 01/27/2024  Last Visit: 10/26/2023  DX: systemic lupus   Current Dose per office note 10/26/2023: hydroxychloroquine  400 mg daily   Okay to refill Plaquenil ?

## 2023-11-01 NOTE — Addendum Note (Signed)
 Addended by: Everlina Gotts M on: 11/01/2023 08:40 AM   Modules accepted: Orders

## 2023-11-02 MED ORDER — HYDROXYCHLOROQUINE SULFATE 200 MG PO TABS: 400.0000 mg | ORAL_TABLET | Freq: Every day | ORAL | 0 refills | Status: DC

## 2023-12-05 DIAGNOSIS — Z79899 Other long term (current) drug therapy: Secondary | ICD-10-CM | POA: Diagnosis not present

## 2023-12-26 ENCOUNTER — Other Ambulatory Visit: Payer: Self-pay | Admitting: Internal Medicine

## 2023-12-26 DIAGNOSIS — M350C Sjogren syndrome with dental involvement: Secondary | ICD-10-CM

## 2023-12-27 NOTE — Telephone Encounter (Signed)
 Last Fill: 10/24/2023 Imuran  / 11/02/2023 PLQ  Eye exam: 12/05/2023 Normal    Labs: 10/26/2023  Glucose 63 Creat 1.04 Total Protein 10.7 Globulin 6.8 AG Ratio 0.6 RBC 3.73 Hemoglobin 9.4 HCT 31.1 MCH 25.2 MCHC 30.2 RDW 15.7   Next Visit: 01/27/2024  Last Visit: 10/26/2023  DX: Systemic lupus erythematosus, unspecified SLE type, unspecified organ involvement status   Current Dose per office note 10/26/2023: Plaquenil  400 mg and Imuran  100 mg daily.   Okay to refill Plaquenil  an Imuran ?

## 2024-01-13 NOTE — Progress Notes (Signed)
 Office Visit Note  Patient: Connie Foster             Date of Birth: Dec 27, 1980           MRN: [id number]             PCP: Stacia Millman, PA Referring: Stacia Millman, PA Visit Date: 01/27/2024   Subjective:    Discussed the use of AI scribe software for clinical note transcription with the patient, who gave verbal consent to proceed.  History of Present Illness   Connie Foster is a 43 y.o. female here for follow up for history of systemic lupus with fatigue, joint pain, abnormal liver function, and leukopenia now on hydroxychloroquine  400 mg daily and started azathioprine  100 mg daily. She continued off the prednisone  without exacerbation.  She has not experienced any episodes of sharp chest pain in the past three months, whereas previously these episodes were occurring regularly. No new rashes have developed during this time. She feels generally well and has not been sick with any colds or viruses recently. No trouble with her current medications is reported.  She experiences some discomfort in her knee, particularly with cooler weather, which she attributes to aging. She describes feeling 'crepitus' in the knee and notes that she can 'determine the storm's coming' due to this sensation. She sometimes experiences swelling in her fingers and ankles but is otherwise feeling okay.  She has a history of thalassemia. She was advised by her oncologist not to take iron supplements due to this condition. She inquires about symptoms of iron deficiency, such as fatigue and restless legs, and learns that restless legs typically occur at night or when trying to sleep, causing a burning or tingling sensation that is relieved by movement.  She is currently taking azathioprine  (Imuran ) and hydroxychloroquine  for her lupus.    Previous HPI 10/26/2023 Connie Foster is a 42 y.o. female here for follow up for history of systemic lupus with fatigue, joint pain, abnormal liver function,  and leukopenia now on hydroxychloroquine  400 mg daily and started azathioprine  100 mg daily.   She experiences sharp chest and back pain that occurs almost like clockwork every few months, lasting for four to five days. The pain is exacerbated by deep breathing and lying down, forcing her to sit up. She describes the pain as 'miserable' and notes that it significantly impacts her comfort during these episodes.   She was previously on prednisone  but has not taken it for a month. There has been no significant change in symptoms since stopping prednisone . No recent respiratory infections or need for antibiotics.   She has noticed new itchy rashes on her arms over the past week, which she associates with being off steroids. She also reports bruising on her arms without any known injury.   No recent mouth or nose ulcers, respiratory infections, or gastrointestinal symptoms like nausea or diarrhea. She reports persistent throat tightness but no swelling in her hands or feet.       Previous HPI 07/25/2023 Connie Foster is a 43 y.o. female here for follow up for history of systemic lupus with fatigue, joint pain, abnormal liver function, and leukopenia now on hydroxychloroquine  400 mg daily and started azathioprine  100 mg daily.    Her skin flare-ups have improved significantly since her last visit in February. She experienced rashes at that time, but they cleared up approximately three weeks later and have not recurred since. She is currently on Imuran  and a low dose of prednisone ,  which have helped manage her symptoms. No new rashes or skin issues are present.   She experiences daily dizziness around 4 or 5 PM, which she manages by sitting down. No other side effects from her medications have been noted.   She describes a recurring issue with her throat, feeling as if something is cutting off her air, making it difficult to breathe deeply. This sensation is accompanied by headaches and is constant  when it occurs. Previous evaluations, including an x-ray and endoscopy, did not reveal any abnormalities. She has not had a laryngoscopy. No history of asthma is present, and a pulmonologist evaluation two to three years ago showed no significant findings.   No recent illnesses and no joint pain, except for some wear and tear in her knees. No new dark spots on her skin are present, and the existing ones are remnants from previous flare-ups.         Previous HPI 04/27/2023 Connie Foster is a 43 y.o. female here for follow up for history of systemic lupus with fatigue, joint pain, abnormal liver function, and leukopenia now on hydroxychloroquine  400 mg daily and started azathioprine  50 mg daily.     She is following up for her autoimmune condition, which has been associated with skin flares. Since starting azathioprine  at one pill a day, some of her rashes have improved, but she has developed new bruises. These bruises are distinct from her usual rashes and are not preceded by hives. No history of easy bruising prior to this.   She is experiencing a rash on the bottom of her foot, which has been painful and made walking difficult. The rash is currently healing but was described as 'horrible' and 'huge', with skin peeling off and remaining red along the border. She continues to use an ointment for this.   In December, she experienced a severe episode of chest pain and difficulty breathing, described as the worst pain she has ever experienced. The pain started in her lower back, radiated over her shoulder, and into her chest. It was exacerbated by lying down, forcing her to sleep sitting up for two weeks. She visited the ER, where various tests were conducted, and her primary care physician prescribed meloxicam, which helped alleviate the pain. She also received an injection of toradol IM that significantly improved her symptoms. She has not taken any steroids since her last visit.      Previous  HPI 02/23/2023 Connie Foster is a 43 y.o. female here for follow up for history of systemic lupus with fatigue, joint pain, abnormal liver function, and leukopenia now on hydroxychloroquine  400 mg daily.  She presents with a chief complaint of recurrent, widespread rashes that have been progressively worsening over the past month. The rashes, which are initially painful and subsequently become itchy, are located on the arms, legs, face, and ears. The patient reports that the individual spots last for approximately two weeks when treated with their son's eczema cream, but prior to this, the spots would persist for months.   The patient has been taking hydroxychloroquine  for an unspecified condition, and despite this, the rashes have continued to appear. The patient denies any recent illnesses and reports that this is a new symptom. They have not experienced rashes like this before.   The patient also reports persistent soreness in an unspecified area and swollen lymph nodes. Despite these symptoms, the patient reports feeling generally well and has been able to receive their flu and COVID vaccinations without any adverse  reactions.      Previous HPI 07/27/2022 Clair Townsel is a 43 y.o. female here for follow up for history of systemic lupus with fatigue, joint pain, abnormal liver function, and leukopenia now on hydroxychloroquine  400 mg daily.  Overall she feels pretty well still has dry eyes and mouth, fatigue, and joint stiffness but symptoms are manageable.  She is using oral lozenges or gum and staying well-hydrated for dry mouth.  Not requiring regular use of OTC anti-inflammatories for joint pain.  She has not experienced any interval infections or complications. Continues to have tenderness and swelling around the neck and base of the jaw.   Previous HPI 04/28/22 Pauline Stanard is a 43 y.o. female here for follow up for lupus now on hydroxychloroquine  400 mg daily.  Since her last  visit she had the liver biopsy obtained and findings were benign with no inflammatory changes identified.  She subsequently started on hydroxychloroquine  initially at 200 and then at 400 she has noticed an improvement with fatigue and urinary frequency, partial improvement with stiffness.  She did not notice any side effects with starting the medicine though for the past few weeks has had decreased appetite and for the past 1 week is experiencing frequent heart palpitations.  These last for only a few seconds at a time but are pretty consistently occurring every day. She does not recall similar symptoms in the past. Not associated with any chest pain, dizziness, lightheadedness, shortness of breath, or leg swelling.   Previous HPI 01/21/22 Leonilda Balzarini is a 43 y.o. female here for follow up for history of lupus with recent onset of more symptoms and also getting evaluation for possible autoimmune hepatitis.  Laboratory testing at our visit was negative for lupus specific antibody markers but did have elevated sedimentation rate of 108.  Also had IgG serum level checked after that which was over 4500.  Overall findings were concerning and has been scheduled for planned liver biopsy next week.  Her symptoms remain basically the same without any change in the past few weeks.   Previous HPI 12/25/2021 Kamilla Arpino is a 43 y.o. female here for history of lupus.  This original diagnosis was 14 years ago on the basis of abnormal lab findings but did not have signs or symptomatic complaints at the time so was never started on any treatment.  She was not seeing a rheumatologist at the time.  More recently had some repeat work-up being evaluated due to feeling worse with new symptoms since about 4 months ago.  This includes evaluation in gastroenterology clinic with elevated liver enzymes which are new.  She has new problems with headache and vertigo symptoms.  Left hip pain is a more chronic problem but has  been increased more than usual.  She is experiencing constipation usually without severe abdominal pain or other GI issues.  She does describe a sensation of dry mouth and increasing urinary frequency compared to her usual.  Overall exertion tolerance is worse than before.  For the past 1 month symptoms have been bad enough that she is stopped doing any routine exercise she previously had been very regular with multiple times per week aerobic exercises.  She had a skin rash on the shins recently that was treated with topical prescription medication and resolves before now.  She denies any issues with oral or nasal ulcers, lymphadenopathy, Raynaud's symptoms, or photosensitive skin rashes.   Labs reviewed 12/2021 ANA 1:1280 fine speckled Smooth muscle Ab neg Mitochondrial Ab neg  Ttg IgA neg   10/2021 CBC WBC 3.8 Hgb 9.6 AST 100 ALT 98 SPEP polyclonal gamma globulin increase HAV Ab pos HBV sAb pos HBV sAg neg   Review of Systems  Constitutional:  Positive for fatigue.  HENT:  Positive for mouth dryness. Negative for mouth sores.   Eyes:  Positive for dryness.  Respiratory:  Negative for shortness of breath.   Cardiovascular:  Positive for palpitations. Negative for chest pain.  Gastrointestinal:  Positive for constipation. Negative for blood in stool and diarrhea.  Endocrine: Negative for increased urination.  Genitourinary:  Negative for involuntary urination.  Musculoskeletal:  Positive for gait problem, myalgias, morning stiffness and myalgias. Negative for joint pain, joint pain, joint swelling, muscle weakness and muscle tenderness.  Skin:  Negative for color change, rash, hair loss and sensitivity to sunlight.  Allergic/Immunologic: Negative for susceptible to infections.  Neurological:  Positive for dizziness. Negative for headaches.  Hematological:  Negative for swollen glands.  Psychiatric/Behavioral:  Positive for sleep disturbance. Negative for depressed mood. The patient is  nervous/anxious.     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   Anemia 04/28/2022   Palpitations 04/28/2022   Systemic lupus erythematosus (HCC) 12/25/2021   Vertigo 12/25/2021   Pain in left hip 12/25/2021   Liver function test abnormality 12/25/2021   Urinary frequency 12/25/2021   Lung nodule    Dyspnea 03/12/2015   Hypokalemia 03/12/2015   Reactive airway disease with wheezing with acute exacerbation 03/12/2015    Past Medical History:  Diagnosis Date   History of hypertension    Hypokalemia    Systemic lupus erythematosus (HCC)     Family History  Problem Relation Age of Onset   Hypertension Mother    Allergies Mother    Diabetes Mother    Healthy Father    Thyroid  disease Brother    Colon cancer Maternal Uncle    Breast cancer Maternal Grandmother    Healthy Son    Esophageal cancer Neg Hx    Stomach cancer Neg Hx    Rectal cancer Neg Hx    Past Surgical History:  Procedure Laterality Date   CESAREAN SECTION  02/03/2004   SLEEVE GASTROPLASTY     TONSILLECTOMY     Social History   Social History Narrative   Not on file   Immunization History  Administered Date(s) Administered   Fluzone Influenza virus vaccine,trivalent (IIV3), split virus 04/26/2023   Influenza,inj,Quad PF,6+ Mos 12/19/2018   Influenza-Unspecified 12/22/2020   Moderna SARS-COV2 Booster Vaccination 12/24/2020   Moderna Sars-Covid-2 Vaccination 06/01/2019, 06/10/2019, 07/02/2019, 07/08/2019, 02/20/2020, 02/24/2020   Pfizer Covid-19 Vaccine Bivalent Booster 45yrs & up 12/22/2020   Tdap 12/19/2018     Objective: Vital Signs: BP 116/74   Pulse 77   Temp 97.9 F (36.6 C)   Resp 16   Ht 5' 5 (1.651 m)   Wt 173 lb 3.2 oz (78.6 kg)   BMI 28.82 kg/m    Physical Exam Eyes:     Conjunctiva/sclera: Conjunctivae normal.  Cardiovascular:     Rate and Rhythm: Normal rate and regular  rhythm.  Pulmonary:     Effort: Pulmonary effort is normal.     Breath sounds: Normal breath sounds.  Lymphadenopathy:     Cervical: No cervical adenopathy.  Skin:    General: Skin is warm and dry.  Neurological:     Mental Status: She is alert.  Psychiatric:        Mood and Affect: Mood normal.      Musculoskeletal Exam:  Shoulders full ROM no tenderness or swelling Elbows full ROM no tenderness or swelling Wrists full ROM no tenderness or swelling Fingers full ROM no tenderness or swelling No paraspinal tenderness to palpation over upper and lower back Hip normal internal and external rotation without pain, no tenderness to lateral hip palpation Knees full ROM no tenderness or swelling, patellofemoral crepitus  Investigation: No additional findings.  Imaging: No results found.  Recent Labs: Lab Results  Component Value Date   WBC 4.5 10/26/2023   HGB 9.4 (L) 10/26/2023   PLT 312 10/26/2023   NA 135 10/26/2023   K 3.8 10/26/2023   CL 105 10/26/2023   CO2 23 10/26/2023   GLUCOSE 63 (L) 10/26/2023   BUN 14 10/26/2023   CREATININE 1.04 (H) 10/26/2023   BILITOT 0.7 10/26/2023   ALKPHOS 61 01/29/2022   AST 24 10/26/2023   ALT 9 10/26/2023   PROT 10.7 (H) 10/26/2023   ALBUMIN 3.3 (L) 01/29/2022   CALCIUM 9.4 10/26/2023   GFRAA >60 03/13/2015    Speciality Comments: PLQ EYE EXAM 12/05/2023 Normal [facility] f/u 1 year  Procedures:  No procedures performed Allergies: Lisinopril   Assessment / Plan:     Visit Diagnoses: Systemic lupus erythematosus, unspecified SLE type, unspecified organ involvement status (HCC) - Plan: Sedimentation rate Well-controlled with improved clinical symptoms. No recurrent pleurisy since last visit and off prednisone . Persistent inflammation noted in labs. Current azathioprine  treatment effective. Methotrexate discussed as alternative if symptoms worsen, with benefits and risks explained. Normal kidney function supports  methotrexate use. - Continue azathioprine . - Monitor for flare-ups; consider methotrexate if symptoms worsen. - Ordered blood tests for iron levels and other parameters. - Refilled prescriptions.  High risk medication use - Plan: CBC with Differential/Platelet, Comprehensive metabolic panel with GFR Been tolerating medication without specific issue.  No serious interval infections. -Checking CBC and CMP for medication monitoring on continued long-term use of azathioprine   Anemia, unspecified type - Plan: Iron, TIBC and Ferritin Panel Thalassemia Contributes to mild persistent anemia. Iron supplementation not recommended due to potential side effects and inefficacy. Genetic condition causing abnormal hemoglobin and smaller red blood cells. - Ordered blood tests for iron levels. - Advised against iron supplementation unless indicated by tests.        Orders: Orders Placed This Encounter  Procedures   Sedimentation rate   CBC with Differential/Platelet   Comprehensive metabolic panel with GFR   Iron, TIBC and Ferritin Panel   No orders of the defined types were placed in this encounter.    Follow-Up Instructions: Return in about 3 months (around 04/28/2024) for SLE on HCQ/AZA f/u 3mos.   Lonni LELON Ester, MD  Note - This record has been created using [employer].  Chart creation errors have been sought, but may not always  have been located. Such creation errors do not reflect on  the standard of medical care.

## 2024-01-27 ENCOUNTER — Encounter: Payer: Self-pay | Admitting: Internal Medicine

## 2024-01-27 ENCOUNTER — Ambulatory Visit: Attending: Internal Medicine | Admitting: Internal Medicine

## 2024-01-27 VITALS — BP 116/74 | HR 77 | Temp 97.9°F | Resp 16 | Ht 65.0 in | Wt 173.2 lb

## 2024-01-27 DIAGNOSIS — D649 Anemia, unspecified: Secondary | ICD-10-CM | POA: Diagnosis not present

## 2024-01-27 DIAGNOSIS — Z79899 Other long term (current) drug therapy: Secondary | ICD-10-CM

## 2024-01-27 DIAGNOSIS — M329 Systemic lupus erythematosus, unspecified: Secondary | ICD-10-CM

## 2024-01-28 LAB — COMPREHENSIVE METABOLIC PANEL WITH GFR
AG Ratio: 0.7 (calc) — ABNORMAL LOW (ref 1.0–2.5)
ALT: 7 U/L (ref 6–29)
AST: 18 U/L (ref 10–35)
Albumin: 3.9 g/dL (ref 3.6–5.1)
Alkaline phosphatase (APISO): 49 U/L (ref 31–125)
BUN: 10 mg/dL (ref 7–25)
CO2: 24 mmol/L (ref 20–32)
Calcium: 9 mg/dL (ref 8.6–10.2)
Chloride: 105 mmol/L (ref 98–110)
Creat: 0.79 mg/dL (ref 0.50–0.99)
Globulin: 5.7 g/dL — ABNORMAL HIGH (ref 1.9–3.7)
Glucose, Bld: 73 mg/dL (ref 65–99)
Potassium: 3.4 mmol/L — ABNORMAL LOW (ref 3.5–5.3)
Sodium: 135 mmol/L (ref 135–146)
Total Bilirubin: 0.8 mg/dL (ref 0.2–1.2)
Total Protein: 9.6 g/dL — ABNORMAL HIGH (ref 6.1–8.1)
eGFR: 94 mL/min/1.73m2 (ref >=60)

## 2024-01-28 LAB — IRON,TIBC AND FERRITIN PANEL
%SAT: 36 % (ref 16–45)
Ferritin: 36 ng/mL (ref 16–232)
Iron: 89 ug/dL (ref 40–190)
TIBC: 245 ug/dL — ABNORMAL LOW (ref 250–450)

## 2024-01-28 LAB — CBC WITH DIFFERENTIAL/PLATELET
Absolute Lymphocytes: 928 {cells}/uL (ref 850–3900)
Absolute Monocytes: 612 {cells}/uL (ref 200–950)
Basophils Absolute: 10 {cells}/uL (ref 0–200)
Basophils Relative: 0.3 %
Eosinophils Absolute: 41 {cells}/uL (ref 15–500)
Eosinophils Relative: 1.2 %
HCT: 29 % — ABNORMAL LOW (ref 35.0–45.0)
Hemoglobin: 9.1 g/dL — ABNORMAL LOW (ref 11.7–15.5)
MCH: 26.1 pg — ABNORMAL LOW (ref 27.0–33.0)
MCHC: 31.4 g/dL — ABNORMAL LOW (ref 32.0–36.0)
MCV: 83.1 fL (ref 80.0–100.0)
MPV: 10.7 fL (ref 7.5–12.5)
Monocytes Relative: 18 %
Neutro Abs: 1809 {cells}/uL (ref 1500–7800)
Neutrophils Relative %: 53.2 %
Platelets: 274 Thousand/uL (ref 140–400)
RBC: 3.49 Million/uL — ABNORMAL LOW (ref 3.80–5.10)
RDW: 14.3 % (ref 11.0–15.0)
Total Lymphocyte: 27.3 %
WBC: 3.4 Thousand/uL — ABNORMAL LOW (ref 3.8–10.8)

## 2024-01-28 LAB — SEDIMENTATION RATE: Sed Rate: 86 mm/h — ABNORMAL HIGH (ref 0–20)

## 2024-04-11 ENCOUNTER — Other Ambulatory Visit: Payer: Self-pay | Admitting: Internal Medicine

## 2024-04-11 DIAGNOSIS — M350C Sjogren syndrome with dental involvement: Secondary | ICD-10-CM

## 2024-04-11 NOTE — Telephone Encounter (Signed)
 Last Fill: 12/27/2023  Eye exam: 12/05/2023 Normal   Labs: 01/27/2024 CBC-WBC:3.4, RBC:3.49, Hemoglobin:9.1, HCT:29.0, MCH:26.1, MCHC:31.4,   CMP-Potassium:3.4, Total Protein:9.6, Globulin:5.7, AG Ratio:0.7  Next Visit: 04/27/2024  Last Visit: 01/27/2024  DX: Systemic lupus erythematosus, unspecified SLE type, unspecified organ involvement status   Current Dose per office note 01/27/2024:   Okay to refill Plaquenil  and Imuran  ? hydroxychloroquine  400 mg daily  azathioprine  100 mg daily.

## 2024-04-18 NOTE — Progress Notes (Unsigned)
 "  Office Visit Note  Patient: Connie Foster             Date of Birth: 1981/02/05           MRN: [id number]             PCP: Stacia Millman, PA Referring: Stacia Millman, PA Visit Date: 04/27/2024   Subjective:  No chief complaint on file.   History of Present Illness: Connie Foster is a 44 y.o. female here for follow up for history of systemic lupus with fatigue, joint pain, abnormal liver function, and leukopenia now on hydroxychloroquine  400 mg daily and started azathioprine  100 mg daily.    Previous HPI 01/27/2024 Connie Foster is a 44 y.o. female here for follow up for history of systemic lupus with fatigue, joint pain, abnormal liver function, and leukopenia now on hydroxychloroquine  400 mg daily and started azathioprine  100 mg daily. She continued off the prednisone  without exacerbation.   She has not experienced any episodes of sharp chest pain in the past three months, whereas previously these episodes were occurring regularly. No new rashes have developed during this time. She feels generally well and has not been sick with any colds or viruses recently. No trouble with her current medications is reported.   She experiences some discomfort in her knee, particularly with cooler weather, which she attributes to aging. She describes feeling 'crepitus' in the knee and notes that she can 'determine the storm's coming' due to this sensation. She sometimes experiences swelling in her fingers and ankles but is otherwise feeling okay.   She has a history of thalassemia. She was advised by her oncologist not to take iron supplements due to this condition. She inquires about symptoms of iron deficiency, such as fatigue and restless legs, and learns that restless legs typically occur at night or when trying to sleep, causing a burning or tingling sensation that is relieved by movement.   She is currently taking azathioprine  (Imuran ) and hydroxychloroquine  for her lupus.       Previous HPI 10/26/2023 Connie Foster is a 44 y.o. female here for follow up for history of systemic lupus with fatigue, joint pain, abnormal liver function, and leukopenia now on hydroxychloroquine  400 mg daily and started azathioprine  100 mg daily.   She experiences sharp chest and back pain that occurs almost like clockwork every few months, lasting for four to five days. The pain is exacerbated by deep breathing and lying down, forcing her to sit up. She describes the pain as 'miserable' and notes that it significantly impacts her comfort during these episodes.   She was previously on prednisone  but has not taken it for a month. There has been no significant change in symptoms since stopping prednisone . No recent respiratory infections or need for antibiotics.   She has noticed new itchy rashes on her arms over the past week, which she associates with being off steroids. She also reports bruising on her arms without any known injury.   No recent mouth or nose ulcers, respiratory infections, or gastrointestinal symptoms like nausea or diarrhea. She reports persistent throat tightness but no swelling in her hands or feet.       Previous HPI 07/25/2023 Connie Foster is a 44 y.o. female here for follow up for history of systemic lupus with fatigue, joint pain, abnormal liver function, and leukopenia now on hydroxychloroquine  400 mg daily and started azathioprine  100 mg daily.    Her skin flare-ups have improved significantly since her last visit  in February. She experienced rashes at that time, but they cleared up approximately three weeks later and have not recurred since. She is currently on Imuran  and a low dose of prednisone , which have helped manage her symptoms. No new rashes or skin issues are present.   She experiences daily dizziness around 4 or 5 PM, which she manages by sitting down. No other side effects from her medications have been noted.   She describes a recurring  issue with her throat, feeling as if something is cutting off her air, making it difficult to breathe deeply. This sensation is accompanied by headaches and is constant when it occurs. Previous evaluations, including an x-ray and endoscopy, did not reveal any abnormalities. She has not had a laryngoscopy. No history of asthma is present, and a pulmonologist evaluation two to three years ago showed no significant findings.   No recent illnesses and no joint pain, except for some wear and tear in her knees. No new dark spots on her skin are present, and the existing ones are remnants from previous flare-ups.         Previous HPI 04/27/2023 Connie Foster is a 44 y.o. female here for follow up for history of systemic lupus with fatigue, joint pain, abnormal liver function, and leukopenia now on hydroxychloroquine  400 mg daily and started azathioprine  50 mg daily.     She is following up for her autoimmune condition, which has been associated with skin flares. Since starting azathioprine  at one pill a day, some of her rashes have improved, but she has developed new bruises. These bruises are distinct from her usual rashes and are not preceded by hives. No history of easy bruising prior to this.   She is experiencing a rash on the bottom of her foot, which has been painful and made walking difficult. The rash is currently healing but was described as 'horrible' and 'huge', with skin peeling off and remaining red along the border. She continues to use an ointment for this.   In December, she experienced a severe episode of chest pain and difficulty breathing, described as the worst pain she has ever experienced. The pain started in her lower back, radiated over her shoulder, and into her chest. It was exacerbated by lying down, forcing her to sleep sitting up for two weeks. She visited the ER, where various tests were conducted, and her primary care physician prescribed meloxicam, which helped alleviate  the pain. She also received an injection of toradol IM that significantly improved her symptoms. She has not taken any steroids since her last visit.      Previous HPI 02/23/2023 Connie Foster is a 44 y.o. female here for follow up for history of systemic lupus with fatigue, joint pain, abnormal liver function, and leukopenia now on hydroxychloroquine  400 mg daily.  She presents with a chief complaint of recurrent, widespread rashes that have been progressively worsening over the past month. The rashes, which are initially painful and subsequently become itchy, are located on the arms, legs, face, and ears. The patient reports that the individual spots last for approximately two weeks when treated with their son's eczema cream, but prior to this, the spots would persist for months.   The patient has been taking hydroxychloroquine  for an unspecified condition, and despite this, the rashes have continued to appear. The patient denies any recent illnesses and reports that this is a new symptom. They have not experienced rashes like this before.   The patient also reports persistent  soreness in an unspecified area and swollen lymph nodes. Despite these symptoms, the patient reports feeling generally well and has been able to receive their flu and COVID vaccinations without any adverse reactions.      Previous HPI 07/27/2022 Connie Foster is a 44 y.o. female here for follow up for history of systemic lupus with fatigue, joint pain, abnormal liver function, and leukopenia now on hydroxychloroquine  400 mg daily.  Overall she feels pretty well still has dry eyes and mouth, fatigue, and joint stiffness but symptoms are manageable.  She is using oral lozenges or gum and staying well-hydrated for dry mouth.  Not requiring regular use of OTC anti-inflammatories for joint pain.  She has not experienced any interval infections or complications. Continues to have tenderness and swelling around the neck and  base of the jaw.   Previous HPI 04/28/22 Connie Foster is a 44 y.o. female here for follow up for lupus now on hydroxychloroquine  400 mg daily.  Since her last visit she had the liver biopsy obtained and findings were benign with no inflammatory changes identified.  She subsequently started on hydroxychloroquine  initially at 200 and then at 400 she has noticed an improvement with fatigue and urinary frequency, partial improvement with stiffness.  She did not notice any side effects with starting the medicine though for the past few weeks has had decreased appetite and for the past 1 week is experiencing frequent heart palpitations.  These last for only a few seconds at a time but are pretty consistently occurring every day. She does not recall similar symptoms in the past. Not associated with any chest pain, dizziness, lightheadedness, shortness of breath, or leg swelling.   Previous HPI 01/21/22 Connie Foster is a 44 y.o. female here for follow up for history of lupus with recent onset of more symptoms and also getting evaluation for possible autoimmune hepatitis.  Laboratory testing at our visit was negative for lupus specific antibody markers but did have elevated sedimentation rate of 108.  Also had IgG serum level checked after that which was over 4500.  Overall findings were concerning and has been scheduled for planned liver biopsy next week.  Her symptoms remain basically the same without any change in the past few weeks.   Previous HPI 12/25/2021 Connie Foster is a 44 y.o. female here for history of lupus.  This original diagnosis was 14 years ago on the basis of abnormal lab findings but did not have signs or symptomatic complaints at the time so was never started on any treatment.  She was not seeing a rheumatologist at the time.  More recently had some repeat work-up being evaluated due to feeling worse with new symptoms since about 4 months ago.  This includes evaluation in  gastroenterology clinic with elevated liver enzymes which are new.  She has new problems with headache and vertigo symptoms.  Left hip pain is a more chronic problem but has been increased more than usual.  She is experiencing constipation usually without severe abdominal pain or other GI issues.  She does describe a sensation of dry mouth and increasing urinary frequency compared to her usual.  Overall exertion tolerance is worse than before.  For the past 1 month symptoms have been bad enough that she is stopped doing any routine exercise she previously had been very regular with multiple times per week aerobic exercises.  She had a skin rash on the shins recently that was treated with topical prescription medication and resolves before now.  She denies any issues with oral or nasal ulcers, lymphadenopathy, Raynaud's symptoms, or photosensitive skin rashes.   Labs reviewed 12/2021 ANA 1:1280 fine speckled Smooth muscle Ab neg Mitochondrial Ab neg Ttg IgA neg   10/2021 CBC WBC 3.8 Hgb 9.6 AST 100 ALT 98 SPEP polyclonal gamma globulin increase HAV Ab pos HBV sAb pos HBV sAg neg   No Rheumatology ROS completed.   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   Anemia 04/28/2022   Palpitations 04/28/2022   Systemic lupus erythematosus (HCC) 12/25/2021   Vertigo 12/25/2021   Pain in left hip 12/25/2021   Liver function test abnormality 12/25/2021   Urinary frequency 12/25/2021   Lung nodule    Dyspnea 03/12/2015   Hypokalemia 03/12/2015   Reactive airway disease with wheezing with acute exacerbation 03/12/2015    Past Medical History:  Diagnosis Date   History of hypertension    Hypokalemia    Systemic lupus erythematosus (HCC)     Family History  Problem Relation Age of Onset   Hypertension Mother    Allergies Mother    Diabetes Mother    Healthy Father    Thyroid  disease Brother     Colon cancer Maternal Uncle    Breast cancer Maternal Grandmother    Healthy Son    Esophageal cancer Neg Hx    Stomach cancer Neg Hx    Rectal cancer Neg Hx    Past Surgical History:  Procedure Laterality Date   CESAREAN SECTION  02/03/2004   SLEEVE GASTROPLASTY     TONSILLECTOMY     Social History   Social History Narrative   Not on file   Immunization History  Administered Date(s) Administered   Fluzone Influenza virus vaccine,trivalent (IIV3), split virus 04/26/2023   Influenza,inj,Quad PF,6+ Mos 12/19/2018   Influenza-Unspecified 12/22/2020   Moderna SARS-COV2 Booster Vaccination 12/24/2020   Moderna Sars-Covid-2 Vaccination 06/01/2019, 06/10/2019, 07/02/2019, 07/08/2019, 02/20/2020, 02/24/2020   Pfizer Covid-19 Vaccine Bivalent Booster 59yrs & up 12/22/2020   Tdap 12/19/2018     Objective: Vital Signs: There were no vitals taken for this visit.   Physical Exam   Musculoskeletal Exam: ***   Investigation: No additional findings.  Imaging: No results found.  Recent Labs: Lab Results  Component Value Date   WBC 3.4 (L) 01/27/2024   HGB 9.1 (L) 01/27/2024   PLT 274 01/27/2024   NA 135 01/27/2024   K 3.4 (L) 01/27/2024   CL 105 01/27/2024   CO2 24 01/27/2024   GLUCOSE 73 01/27/2024   BUN 10 01/27/2024   CREATININE 0.79 01/27/2024   BILITOT 0.8 01/27/2024   ALKPHOS 61 01/29/2022   AST 18 01/27/2024   ALT 7 01/27/2024   PROT 9.6 (H) 01/27/2024   ALBUMIN 3.3 (L) 01/29/2022   CALCIUM 9.0 01/27/2024   GFRAA >60 03/13/2015    Speciality Comments: PLQ EYE EXAM 12/05/2023 Normal [facility] f/u 1 year  Procedures:  No procedures performed Allergies: Lisinopril   Assessment / Plan:     Visit Diagnoses:  Assessment & Plan Systemic lupus erythematosus, unspecified SLE type, unspecified organ involvement status (HCC)     High risk medication use     Anemia, unspecified type      ***  Follow-Up Instructions: No follow-ups on  file.   Brooklinn Longbottom M Andren Bethea, CMA  Note - This record has been created using Animal nutritionist.  Chart creation errors have  been sought, but may not always  have been located. Such creation errors do not reflect on  the standard of medical care. "

## 2024-04-18 NOTE — Assessment & Plan Note (Signed)
 SABRA

## 2024-04-27 ENCOUNTER — Ambulatory Visit: Admitting: Internal Medicine

## 2024-04-27 DIAGNOSIS — D649 Anemia, unspecified: Secondary | ICD-10-CM

## 2024-04-27 DIAGNOSIS — Z79899 Other long term (current) drug therapy: Secondary | ICD-10-CM

## 2024-04-27 DIAGNOSIS — M329 Systemic lupus erythematosus, unspecified: Secondary | ICD-10-CM

## 2024-06-13 ENCOUNTER — Ambulatory Visit: Admitting: Internal Medicine
